# Patient Record
Sex: Male | Born: 1960
Health system: Southern US, Community
[De-identification: ages and names within clinical notes are randomized; demographics above are authoritative.]

## PROBLEM LIST (undated history)

## (undated) DIAGNOSIS — K5909 Other constipation: Secondary | ICD-10-CM

## (undated) DIAGNOSIS — F329 Major depressive disorder, single episode, unspecified: Secondary | ICD-10-CM

## (undated) DIAGNOSIS — B182 Chronic viral hepatitis C: Secondary | ICD-10-CM

## (undated) DIAGNOSIS — Z8673 Personal history of transient ischemic attack (TIA), and cerebral infarction without residual deficits: Secondary | ICD-10-CM

## (undated) DIAGNOSIS — M5136 Other intervertebral disc degeneration, lumbar region: Secondary | ICD-10-CM

## (undated) DIAGNOSIS — M1990 Unspecified osteoarthritis, unspecified site: Secondary | ICD-10-CM

## (undated) DIAGNOSIS — N281 Cyst of kidney, acquired: Secondary | ICD-10-CM

## (undated) DIAGNOSIS — F32A Depression, unspecified: Secondary | ICD-10-CM

## (undated) DIAGNOSIS — Z8639 Personal history of other endocrine, nutritional and metabolic disease: Secondary | ICD-10-CM

## (undated) DIAGNOSIS — K219 Gastro-esophageal reflux disease without esophagitis: Secondary | ICD-10-CM

## (undated) DIAGNOSIS — H9193 Unspecified hearing loss, bilateral: Secondary | ICD-10-CM

## (undated) DIAGNOSIS — M549 Dorsalgia, unspecified: Secondary | ICD-10-CM

## (undated) DIAGNOSIS — J449 Chronic obstructive pulmonary disease, unspecified: Secondary | ICD-10-CM

## (undated) DIAGNOSIS — Z87442 Personal history of urinary calculi: Secondary | ICD-10-CM

## (undated) DIAGNOSIS — N529 Male erectile dysfunction, unspecified: Secondary | ICD-10-CM

## (undated) DIAGNOSIS — M51369 Other intervertebral disc degeneration, lumbar region without mention of lumbar back pain or lower extremity pain: Secondary | ICD-10-CM

## (undated) DIAGNOSIS — G8929 Other chronic pain: Secondary | ICD-10-CM

## (undated) DIAGNOSIS — Z9889 Other specified postprocedural states: Secondary | ICD-10-CM

## (undated) DIAGNOSIS — M4800 Spinal stenosis, site unspecified: Secondary | ICD-10-CM

## (undated) HISTORY — DX: Spinal stenosis, site unspecified: M48.00

## (undated) HISTORY — PX: OTHER SURGICAL HISTORY: SHX169

## (undated) HISTORY — DX: Depression, unspecified: F32.A

## (undated) HISTORY — PX: POSTERIOR LUMBAR FUSION: SHX6036

## (undated) HISTORY — DX: Other intervertebral disc degeneration, lumbar region: M51.36

## (undated) HISTORY — DX: Other intervertebral disc degeneration, lumbar region without mention of lumbar back pain or lower extremity pain: M51.369

## (undated) HISTORY — DX: Major depressive disorder, single episode, unspecified: F32.9

---

## 1987-10-18 HISTORY — PX: OTHER SURGICAL HISTORY: SHX169

## 1988-10-17 HISTORY — PX: ANKLE SURGERY: SHX546

## 1993-10-17 HISTORY — PX: KNEE SURGERY: SHX244

## 1998-10-17 HISTORY — PX: ROTATOR CUFF REPAIR: SHX139

## 2003-11-21 ENCOUNTER — Ambulatory Visit (HOSPITAL_COMMUNITY): Admission: RE | Admit: 2003-11-21 | Discharge: 2003-11-21 | Payer: Self-pay | Admitting: Neurology

## 2004-01-23 ENCOUNTER — Ambulatory Visit (HOSPITAL_COMMUNITY): Admission: RE | Admit: 2004-01-23 | Discharge: 2004-01-23 | Payer: Self-pay | Admitting: Neurology

## 2004-10-04 ENCOUNTER — Inpatient Hospital Stay (HOSPITAL_COMMUNITY): Admission: RE | Admit: 2004-10-04 | Discharge: 2004-10-07 | Payer: Self-pay | Admitting: Neurosurgery

## 2005-05-15 ENCOUNTER — Ambulatory Visit (HOSPITAL_COMMUNITY): Admission: RE | Admit: 2005-05-15 | Discharge: 2005-05-15 | Payer: Self-pay | Admitting: Neurosurgery

## 2005-05-21 ENCOUNTER — Ambulatory Visit (HOSPITAL_COMMUNITY): Admission: RE | Admit: 2005-05-21 | Discharge: 2005-05-21 | Payer: Self-pay | Admitting: Neurosurgery

## 2005-05-27 ENCOUNTER — Ambulatory Visit (HOSPITAL_COMMUNITY): Admission: RE | Admit: 2005-05-27 | Discharge: 2005-05-27 | Payer: Self-pay | Admitting: Neurosurgery

## 2006-06-26 ENCOUNTER — Inpatient Hospital Stay (HOSPITAL_COMMUNITY): Admission: RE | Admit: 2006-06-26 | Discharge: 2006-06-29 | Payer: Self-pay | Admitting: Neurosurgery

## 2006-11-07 ENCOUNTER — Encounter: Admission: RE | Admit: 2006-11-07 | Discharge: 2006-11-14 | Payer: Self-pay | Admitting: Neurosurgery

## 2007-06-13 ENCOUNTER — Emergency Department (HOSPITAL_COMMUNITY): Admission: EM | Admit: 2007-06-13 | Discharge: 2007-06-13 | Payer: Self-pay | Admitting: Emergency Medicine

## 2007-07-01 ENCOUNTER — Encounter: Admission: RE | Admit: 2007-07-01 | Discharge: 2007-07-01 | Payer: Self-pay | Admitting: Neurosurgery

## 2007-09-03 ENCOUNTER — Encounter: Admission: RE | Admit: 2007-09-03 | Discharge: 2007-12-02 | Payer: Self-pay | Admitting: Neurosurgery

## 2007-12-21 ENCOUNTER — Ambulatory Visit: Payer: Self-pay | Admitting: Gastroenterology

## 2008-02-22 ENCOUNTER — Encounter (INDEPENDENT_AMBULATORY_CARE_PROVIDER_SITE_OTHER): Payer: Self-pay | Admitting: Interventional Radiology

## 2008-02-22 ENCOUNTER — Ambulatory Visit (HOSPITAL_COMMUNITY): Admission: RE | Admit: 2008-02-22 | Discharge: 2008-02-22 | Payer: Self-pay | Admitting: Gastroenterology

## 2008-03-20 ENCOUNTER — Ambulatory Visit: Payer: Self-pay | Admitting: Gastroenterology

## 2009-04-15 ENCOUNTER — Ambulatory Visit (HOSPITAL_COMMUNITY): Admission: RE | Admit: 2009-04-15 | Discharge: 2009-04-15 | Payer: Self-pay | Admitting: Neurosurgery

## 2009-11-11 ENCOUNTER — Encounter
Admission: RE | Admit: 2009-11-11 | Discharge: 2010-02-09 | Payer: Self-pay | Admitting: Physical Medicine & Rehabilitation

## 2009-11-12 ENCOUNTER — Ambulatory Visit: Payer: Self-pay | Admitting: Physical Medicine & Rehabilitation

## 2009-11-26 ENCOUNTER — Ambulatory Visit: Payer: Self-pay | Admitting: Physical Medicine & Rehabilitation

## 2009-12-25 ENCOUNTER — Ambulatory Visit: Payer: Self-pay | Admitting: Physical Medicine & Rehabilitation

## 2010-01-22 ENCOUNTER — Ambulatory Visit: Payer: Self-pay | Admitting: Physical Medicine & Rehabilitation

## 2010-05-12 ENCOUNTER — Ambulatory Visit (HOSPITAL_COMMUNITY): Admission: RE | Admit: 2010-05-12 | Discharge: 2010-05-12 | Payer: Self-pay | Admitting: Orthopaedic Surgery

## 2010-11-07 ENCOUNTER — Encounter: Payer: Self-pay | Admitting: Family Medicine

## 2010-11-22 ENCOUNTER — Other Ambulatory Visit (HOSPITAL_COMMUNITY): Payer: Self-pay | Admitting: Neurosurgery

## 2010-11-22 DIAGNOSIS — M542 Cervicalgia: Secondary | ICD-10-CM

## 2010-11-22 DIAGNOSIS — M545 Low back pain: Secondary | ICD-10-CM

## 2010-11-25 ENCOUNTER — Other Ambulatory Visit (HOSPITAL_COMMUNITY): Payer: Self-pay

## 2010-12-03 ENCOUNTER — Ambulatory Visit (HOSPITAL_COMMUNITY)
Admission: RE | Admit: 2010-12-03 | Discharge: 2010-12-03 | Disposition: A | Discharge status: Home / Self Care | Payer: PRIVATE HEALTH INSURANCE | Source: Ambulatory Visit | Attending: Neurosurgery | Admitting: Neurosurgery

## 2010-12-03 ENCOUNTER — Ambulatory Visit (HOSPITAL_COMMUNITY): Payer: Self-pay

## 2010-12-03 DIAGNOSIS — M47812 Spondylosis without myelopathy or radiculopathy, cervical region: Secondary | ICD-10-CM | POA: Insufficient documentation

## 2010-12-03 DIAGNOSIS — M549 Dorsalgia, unspecified: Secondary | ICD-10-CM | POA: Insufficient documentation

## 2010-12-03 DIAGNOSIS — M79609 Pain in unspecified limb: Secondary | ICD-10-CM | POA: Insufficient documentation

## 2010-12-03 DIAGNOSIS — M545 Low back pain: Secondary | ICD-10-CM

## 2010-12-03 DIAGNOSIS — M542 Cervicalgia: Secondary | ICD-10-CM | POA: Insufficient documentation

## 2010-12-03 MED ORDER — GADOBENATE DIMEGLUMINE 529 MG/ML IV SOLN
20.0000 mL | Freq: Once | INTRAVENOUS | Status: AC
  Administered 2010-12-03: 20 mL via INTRAVENOUS

## 2011-01-11 DIAGNOSIS — F341 Dysthymic disorder: Secondary | ICD-10-CM | POA: Insufficient documentation

## 2011-01-11 DIAGNOSIS — B1920 Unspecified viral hepatitis C without hepatic coma: Secondary | ICD-10-CM | POA: Insufficient documentation

## 2011-01-11 DIAGNOSIS — F329 Major depressive disorder, single episode, unspecified: Secondary | ICD-10-CM | POA: Insufficient documentation

## 2011-01-11 DIAGNOSIS — M5430 Sciatica, unspecified side: Secondary | ICD-10-CM | POA: Insufficient documentation

## 2011-01-11 DIAGNOSIS — IMO0002 Reserved for concepts with insufficient information to code with codable children: Secondary | ICD-10-CM

## 2011-01-11 DIAGNOSIS — F419 Anxiety disorder, unspecified: Secondary | ICD-10-CM

## 2011-03-04 NOTE — Op Note (Signed)
NAME:  Shawn Russell, Shawn Russell NO.:  [id number]   MEDICAL RECORD NO.:  [id number]          PATIENT TYPE:  INP   LOCATION:  3005                         FACILITY:  [facility]   PHYSICIAN:  Cristi Loron, M.D.DATE OF BIRTH:  19-Oct-1960   DATE OF PROCEDURE:  06/26/2006  DATE OF DISCHARGE:                                 OPERATIVE REPORT   BRIEF HISTORY:  The patient is a 50 year old white male who has previously  undergone a L4-5 decompression fusion with bilateral laminotomies at L3  several years ago.  Initially, he did well but has developed recurrent back  and bilateral hip and leg pain consistent with neurogenic claudication.  He  was worked up with a lumbar MRI and lumbar CT which demonstrated he had  developed severe multifactorial spinal stenosis L3-4 and moderate stenosis  at L2-3.  He also had a component of congenital spinal stenosis.  I  discussed the various treatment options with the patient including surgery.  The patient has weighed the risks, benefits and alternatives to surgery and  would like to proceed with lumbar decompression and fusion.   PREOPERATIVE DIAGNOSIS:  1. L2-3, L3-4 multifactorial spinal stenosis.  2. Congenital spinal stenosis.  3. Degenerative disease.  4. Lumbago.  5. Lumbar radiculopathy.   POSTOPERATIVE DIAGNOSIS:  1. L2-3, L3-4 multifactorial spinal stenosis.  2. Congenital spinal stenosis.  3. Degenerative disease.  4. Lumbago.  5. Lumbar radiculopathy.   PROCEDURE:  Redo L3 laminectomy; L2-3 and L3-4 transforaminal lumbar  interbody fusion; insertion of L2-3 and L3-4 interbody prosthesis (Capstone  PEEK cage), posterior segmental instrumentation L2 down to L5 with Legacy  Titanium Pedicle Screws and Rods; posterolateral arthrodesis L2-3 and L3-4  with local morselized autograft bone and Vitoss Bone Graft Extender.   SURGEON:  Dr. Delma Officer.   ASSISTANT:  Dr. Shirlean Kelly.   ANESTHESIA:  General endotracheal.   ESTIMATED BLOOD LOSS:  250 mL.   SPECIMENS:  None.   DRAINS:  None.   COMPLICATIONS:  None.   DESCRIPTION OF THE PROCEDURE:  The patient was brought to the operating room  by anesthesia team.  General endotracheal anesthesia was induced.  The  patient was carefully turned to the prone position on the Wilson frame.  His  lumbosacral region was then shaved with the clippers and then prepared with  Betadine scrub and Betadine solution.  Sterile drapes were applied.  I then  injected the area to incised with Marcaine with epinephrine solution.  I  used a scalpel to make a linear midline incision through the patient's  previous surgical scar and extended in a cephalad direction.  I used the  electrocautery to perform a bilateral subperiosteal dissection exposing  spinous processes of the lamina of 1, 2 and 3.  We then loosened the caps on  the old rods and screws at L4-5 and removed the old rods.  We then used a  high-speed drill to perform bilateral L2 and L3 laminotomies.  We  encountered an expected amount of epidural fibrosis at L3-4, i.e. where the  previous surgery was, and we carefully dissected through  the epidural  fibrosis.  We completed the L3 laminectomy using Kerrison punch and  performed a foraminotomy about the bilateral L3 and L4 nerve roots.  We then  widened the laminotomies bilaterally at L2 and performed a foramina about  the L3 nerve root.  We then turned our attention to the posterior lumbar  interbody fusion.  We incised the L2-3 and 3-4 intervertebral disk on the  left side using the 15 blade scalpel.  We performed aggressive  intervertebral dissection using the pituitary forceps and the Scoville and  Epstein curettes.  We cleared soft tissue from the  vertebral endplates at  L2-3 and 3-4 in preparation for the posterior lumbar interbody fusion.  We  then prefilled a 12 x 26 and a 14 x 26-mm Capstone interbody PEEK cage with  local autograft bone and Vitoss Bone  Graft Extender, and then we inserted 12  x 26 cage into the L2-3 interspace and a 14 x 26 cage into the L3-4  interspace, of course after retracting neural structures out of harm's way.  There was a good snug fit of the interbody prosthesis at both levels.  I  should mention we filled ventral and posterior to the prosthesis with Vitoss  and local autograft bone, completing the posterior lumbar interbody fusion.   We now turned attention to the posterior segmental instrumentation.  Under  fluoroscopic guidance, I cannulated the bilateral L2 and 3 pedicles with the  bone probe.  I then tapped the pedicles with 6.5-mm tap, probed inside the  tapped pedicles with a ball probe to rule out cortical breeches and then  inserted 7.5 x 45 pedicle  screws bilaterally at L2 and 3 under fluoroscopic  guidance.  We then palpated along the medial aspect of the bilateral L2 and  L3 pedicles and noted there was no cortical breeches, and nerve roots were  not injured.  We then cut the appropriate length rod.  We bent the rod at  the appropriate lordosis and then connected unilateral pedicle screws from  L2 down to L5 with a rod.  We fashioned it in place using the caps  which we  tightened appropriately, and then we placed a cross connector between the  two rods in which we also flushed tightened appropriately completing the  instrumentation.   We now turned attention to posterolateral arthrodesis, removed the soft  tissue from the L2-3 and 3-4 facettes, using the Leksell rongeur as well as  high-speed drill.  We then laid a combination of Vitoss Bone Graft Extender  and local autograft bone over these decorticated posterolateral structures  completing the posterolateral arthrodesis.  We then irrigated the wound out  with bacitracin solution and removed the solution.  We then inspected the thecal sac at bilateral L2, 3 and 4 nerve roots and noted the neural  structures were well decompressed.  We obtained  hemostasis using bipolar  electrocautery and then removed the retractor.  We reapproximated the  patient's thoracolumbar fascia with interrupted #1 Vicryl suture, the  subcutaneous tissue with interrupted 2-0 Vicryl suture and skin with Steri-  Strips and Benzoin.  The wound was then coated with bacitracin ointment.  A  sterile dressing was applied.  All drapes were removed.  The patient was  subsequently turned to supine position where he was extubated by anesthesia  team and transported to post anesthesia care unit in stable condition.  All  sponge, instrument and needle counts were correct at the end of this case.  Cristi Loron, M.D.  Electronically Signed    JDJ/MEDQ  D:  06/26/2006  T:  06/27/2006  Job:  [id number]

## 2011-03-04 NOTE — Discharge Summary (Signed)
NAME:  Shawn Russell, Shawn Russell NO.:  [id number]   MEDICAL RECORD NO.:  [id number]          PATIENT TYPE:  INP   LOCATION:  3009                         FACILITY:  [facility]   PHYSICIAN:  Cristi Loron, M.D.DATE OF BIRTH:  Dec 30, 1960   DATE OF ADMISSION:  10/04/2004  DATE OF DISCHARGE:  10/07/2004                                 DISCHARGE SUMMARY   BRIEF HISTORY:  The patient is a 50 year old white male who suffered from  chronic back and leg pain.  It became severe.  He has failed medical  management and was worked up with a lumbar MRI which demonstrated he had  diffuse congenital lumbar spinal stenosis.  He had severe multifactorial  spinal stenosis at L4-5 with mild stenosis at L3-4.  I discussed the various  treatment options with the patient.  The patient has weighed the risks,  benefits, and alternatives of surgery, has decided to proceed with an L3-4  and L4-5 decompression as well as a fusion at the L4-5.   For further details of this admission, please refer to typed history and  physical.   HOSPITAL COURSE:  The patient was admitted to [facility] on  October 04, 2004.  On the day of admission, I performed an L3-4  decompression and L4-5 fusion.  The surgery went well and for further  details of this operation, please refer to typed operative note.   POSTOPERATIVE COURSE:  The patient's postoperative course was unremarkable.  We had PT and OT see him.  By the postoperative day #3, i.e., October 07, 2004, the patient was afebrile, vital signs stable.  He was eating well,  ambulating well and his wound was healing well without signs of infection  and was requesting discharge to home.  He was therefore discharged home on  October 07, 2004.   DISCHARGE INSTRUCTIONS:  The patient was given written discharge  instructions, instructed to follow up with me in four weeks.   DISCHARGE PRESCRIPTIONS:  1.  Percocet 10/325, #100, one p.o. q.4h. p.r.n. for  pain.  2.  Valium 5 mg, #50, one p.o. q.6h. p.r.n. for muscle spasm.   FINAL DIAGNOSES:  An L3-4 and L4-5 spinal stenosis, degenerative disease,  lumbar radiculopathy, lumbago.   PROCEDURE PERFORMED:  An L3 and L5 laminectomy, foraminotomy; L4-5 posterior  lumbar interbody fusion with placement of L4-5 interbody prosthesis,  (capstone peek cages); L4-5 posterior and lateral segment instrumentation  with Legacy titanium pedicle screws and rods; L4-5 posterolateral  arthrodesis, local morselized autograft bone and Kronos bone filler.      JDJ/MEDQ  D:  12/09/2004  T:  12/09/2004  Job:  [id number]

## 2011-03-04 NOTE — Op Note (Signed)
NAME:  Shawn Russell, Shawn Russell NO.:  [id number]   MEDICAL RECORD NO.:  [id number]          PATIENT TYPE:  INP   LOCATION:  3009                         FACILITY:  [facility]   PHYSICIAN:  Cristi Loron, M.D.DATE OF BIRTH:  [birth year]   DATE OF PROCEDURE:  10/04/2004  DATE OF DISCHARGE:                                 OPERATIVE REPORT   BRIEF HISTORY:  The patient is a 50 year old white male who has suffered  from chronic back and leg pain.  It became severe.  He has failed medical  management and was worked up with a lumbar MRI which demonstrated the  patient had diffuse congenital lumbar spinal stenosis.  He also had  multifactorial severe spinal stenosis at L4-5 with mild spinal stenosis at  L3-4.  I discussed the various treatment options with the patient including  surgery.  The patient has weighed the risks, benefits and alternatives of  surgery and has decided to proceed with an L3-4 and 4-5 decompression and  fusion at L4-5.   PREOPERATIVE DIAGNOSIS:  L3-4 and L4-5 spinal stenosis, lumbar radiculopathy  and lumbago.   POSTOPERATIVE DIAGNOSIS:  L3-4 and L4-5 spinal stenosis, lumbar  radiculopathy and lumbago.   PROCEDURE:  L3 and L5 laminectomy and foraminotomy; L4-5 posterior lumbar  interbody fusion with placement of L4-5 interbody prosthesis, L4-5 posterior  non-segmental instrumentation with Legacy titanium pedicle screws and rods,  L4-5 posterolateral arthrodesis with local morselized autograft bone and  Cronos bone filler.   SURGEON:  Cristi Loron, M.D.   ASSISTANT:  Hilda Lias, M.D.   ANESTHESIA:  General endotracheal anesthesia.   ESTIMATED BLOOD LOSS:  200 mL.   SPECIMENS:  None.   DRAINS:  None.   COMPLICATIONS:  None.   DESCRIPTION OF PROCEDURE:  The patient was brought to the operating room by  the anesthesia team.  General endotracheal anesthesia was induced.  The  patient was turned to the prone position on the Wilson frame.  His  lumbosacral region was then shaved with the clippers and prepared with  Betadine scrub and solution.  Sterile drapes were applied.  I then injected  the area to be incised with Marcaine with epinephrine solution and used a  scalpel to make a linear midline incision up the L3-4 and L4-5 interspace.  I used electrocautery to provide a subperiosteal dissection exposing the  spinous process at the lamina of L3, 4 and 5.  I inserted the Versatrax  retractor for exposure after obtaining an intraoperative radiograph to  confirm our location.  We began by incising the L4-5 and L3-4 interspinous  ligament with the scalpel and then we used the Leksell rongeur to remove the  L4 spinous process and the L4 lamina.  We saved this bone and later created  enough soft tissue and morselized it to be used in the fusion process.  We  then used the high speed drill to perform bilateral L4 and L3 laminotomy. We  then completed the L4 laminectomy and widened the L3 laminotomy using the  Kerrison punch.  We removed the L3-4 and L4-5 ligamentum flavum.  We  performed foraminotomies at the bilateral L4 and L5 nerve roots. We  encountered a considerable amount of stenosis at the cephalad aspect of the  L5 lamina. We therefore used the high speed drill to thin out the cephalad  aspect of the L5 lamina and performed a partial laminectomy over the  cephalad aspect of the L5 lamina to further decompress the thecal sac and  the L5 nerve roots. At this point, we were satisfied with the decompression  and we now turned our attention to the fusion.   We freed the thecal sac and the L5 nerve roots from the epidural tissue and  then gently retracted with the D'Errico retractor exposing the L4-5  intervertebral disk.  We incised the intervertebral disk to perform an  aggressive diskectomy using the pituitary forceps and the Epstein and  Scoville curette.  We did this bilaterally.  We then prepared the vertebral  end  plates at Z6-1 by inserting a disk space spreader in the contralateral  disk space, distracting the interspace and then clearing all the soft tissue  of the ipsilateral disk space using the curette.  We then placed a 12 x 26  mm Capsin Peak cage which had been pre-filled with local morselized  autograft bone and Cronos bone filler into the ipsilateral disk space of  course after retracting the neural structures out of harm's way.  We then  removed the distractor from the contralateral side and then prepared the  vertebral interspace at that side in an analogous fashion. We filled  medially with  local morselized autograft bone and Cronos bone scaffolding.  We then placed a second Capsin Peak cage, 12 x 26, in the contralateral disk  space again after retracting the neural structures out of harm's way  completing the posterior lumbar interbody fusion.   We now turned our attention to the instrumentation.  We used the  electrocautery to expose the transverse process at L4 and L5 bilaterally and  the under fluoroscopic guidance we cannulated the L4 and L5 pedicles with  the bone probe, tapped the pedicles with a 5.5 mm tap, probed and tapped the  pedicles to rule out cortical breaches and then inserted a 6.5 x 45 mm  pedicle screw bilaterally at L4 and L5 under fluoroscopic guidance. We then  probed about the medial aspect bilaterally at the L4 and L5 pedicles and  noted that there were no cortical breaches and the L4 and L5 nerve roots  were not injured.  We then connected the unilateral pedicle screw to the  lordotic rod in a compressive construct and then secured the rod in place  with the caps which we tightened appropriately completing the  instrumentation.   We now turned our attention to the posterolateral arthrodesis.  We used a  high speed drill to decorticate the remainder of the L4-5 facet and the L4  pars region and transverse process of L4 and L5.  We then laid a combination of  local morselized autograft bone and Cronos bone scaffolding over these  decorticated posterolateral structures completing the posterolateral  arthrodesis.  We then obtained stringent hemostasis using bipolar  electrocautery.  We copiously irrigated the wound out with Bacitracin  solution.  We removed the solution and then we inspected the thecal sac and  noted that it was well decompressed as well as the bilateral L4 and L5 nerve  roots.  We then removed the Versatrax retractor and reapproximated the  patient's thoracolumbar fascia with interrupted #  1 Vicryl suture, the  subcutaneous with interrupted 2-0 Vicryl suture and the skin with Steri-  Strips and Benzoin.  The wound was then coated with Bacitracin ointment and  a sterile dressing was applied.  The drapes were removed.  The patient was  subsequently returned to the supine position where he was extubated by the  anesthesia team and transported to the post-anesthesia care unit in stable  condition.  All sponge, instrument and needle counts were correct at the end  of the case.       JDJ/MEDQ  D:  10/04/2004  T:  10/05/2004  Job:  [id number]

## 2011-03-04 NOTE — Discharge Summary (Signed)
NAME:  Shawn Russell, Shawn Russell NO.:  [id number]   MEDICAL RECORD NO.:  [id number]          PATIENT TYPE:  INP   LOCATION:  3005                         FACILITY:  [facility]   PHYSICIAN:  Cristi Loron, M.D.DATE OF BIRTH:  01-23-1961   DATE OF ADMISSION:  06/26/2006  DATE OF DISCHARGE:  06/29/2006                                 DISCHARGE SUMMARY   BRIEF HISTORY:  The patient is a 50 year old white male who has previously  undergone an L4-5 decompression and fusion with bilateral laminotomies at L3  several years ago.  Initially, he did well but has developed recurrent back  and bilateral hip and leg pain consistent with a neurogenic claudication.  He was worked with worked with a lumbar MRI and a lumbar CT, which  demonstrated he had developed multifactorial spinal stenosis at L3-4 and  minor stenosis at L2-3.  He also had a component of congenital spinal  stenosis.  I discussed the various treatment options with the patient  including surgery.  The patient is aware of the risk, benefits, and  alternatives to surgery and elects to proceed with lumbar decompression and  fusion.   For further details of this admission, please refer to the type history and  physical.   HOSPITAL COURSE:  I admitted the patient to [facility] on June 26, 2006.  On the day of admission, I performed an L2-3 and L3-4  decompression and fusion.  The surgery went well and for further details of  this operation, please refer to typed operative report.   POSTOPERATIVE COURSE:  The patient's postoperative course was unremarkable.  He was discharged home on postop day #3.   DISCHARGE INSTRUCTIONS:  The patient was given written discharge  instructions and instructed to follow up with me in 4 weeks.   DISCHARGE PRESCRIPTIONS:  1. Percocet 10/325, #100, one p.o. q.4 hours p.r.n. for pain.  2. Valium 5 mg, #50, one p.o. q.6 hours p.r.n. muscle spasm.   FINAL DIAGNOSIS:  L2-3 and L3-4  multifactorial spinal stenosis, congenital  spinal stenosis, degenerative disk disease, lumbago, lumbar radiculopathy.   PROCEDURE PERFORMED:  Redo L3 laminectomy; L2-3 and L3-4 transforaminal  lumbar interbody fusion; insertion of L2-3 and L3-4 interbody prosthesis  (capsule and PEEK cages); posterior segmental instrumentation L2-L5 with  Legacy titanium pedicle screws and rods; posterolateral orthesis L2-3 and L3-  4 with local morcellized autograft bone and vitos bone-graft extender.      Cristi Loron, M.D.  Electronically Signed    JDJ/MEDQ  D:  07/28/2006  T:  07/29/2006  Job:  [id number]

## 2012-05-10 ENCOUNTER — Ambulatory Visit (INDEPENDENT_AMBULATORY_CARE_PROVIDER_SITE_OTHER): Payer: PRIVATE HEALTH INSURANCE | Admitting: Gastroenterology

## 2012-05-10 DIAGNOSIS — B182 Chronic viral hepatitis C: Secondary | ICD-10-CM

## 2012-05-12 LAB — CBC WITH DIFFERENTIAL/PLATELET
Basophils Absolute: 0 10*3/uL (ref 0.0–0.1)
Eosinophils Absolute: 0.3 10*3/uL (ref 0.0–0.7)
Eosinophils Relative: 6 % — ABNORMAL HIGH (ref 0–5)
MCH: 32.3 pg (ref 26.0–34.0)
MCV: 90.9 fL (ref 78.0–100.0)
Platelets: 198 10*3/uL (ref 150–400)
RDW: 13.5 % (ref 11.5–15.5)

## 2012-05-12 LAB — HEPATIC FUNCTION PANEL
ALT: 30 U/L (ref 0–53)
AST: 35 U/L (ref 0–37)
Alkaline Phosphatase: 63 U/L (ref 39–117)
Bilirubin, Direct: 0.1 mg/dL (ref 0.0–0.3)
Indirect Bilirubin: 0.3 mg/dL (ref 0.0–0.9)

## 2012-05-17 NOTE — Progress Notes (Signed)
NAME:  Shawn Russell, FOUNTAIN  MR#:  [id number]      DATE:  05/10/2012  DOB:  [birth year]       REFERRING PHYSICIAN: Ernestina Penna, M.D., [facility], [address] [place], [place] [address], Fax # [phone]  consulting physician:  Tressie Stalker, M.D., [facility], [address], [address], [place], [place] [address].  reason for visit:   Follow up of genotype 1 hepatitis C.   HISTORY:  The patient returns today unaccompanied. He is a 51 year old gentleman with history of genotype 1 hepatitis C, with a liver biopsy on 02/22/2008, showing grade 2, stage II disease without significant iron deposition. He was treated with a combination of pegylated interferon and telaprevir for 48 weeks as part of a protocol. I do not have his viral kinetics at this time, but in speaking to his research coordinators, she was certain he received telaprevir and not placebo. He was a relapser. He required dose reduction in his pegylated interferon to 135 mcg because of neutropenia, but he was able to maintain a full dose of ribavirin 600 mg b.i.d. His start date for treatment was around 05/17/2008, and he completed therapy on his Pegasys on 04/10/2009, and his ribavirin 04/16/2009. He required the addition of Celexa through therapy since 08/08/2008, for mood stability. As previously mentioned, he was a relapser. Brooke Dare, the coordinator will look up viral kinetics and send them to me.   He had contacted our office complaining of right flank pain, which has been present for several months. When asked when it occurs, he reports it can start in the morning, occur anytime during the day. There is no associated symptoms such as nausea, vomiting, diarrhea, or constipation. There is no particular foods that bring this on. There is no relationship to eating. When I ask about any associated symptoms, he then started into a long conversation about his back pain, and how this  interferes with his sleeping and how he sleeps in a recliner.   There are truly no symptoms referable to his history of hepatitis C. There is no jaundice, acholic stools, or dark urine. His weight is up from the last previously documented weight of 96.2 kilograms, at current weight of 100.9 kilograms. There are no symptoms to suggest decompensated or cryoglobulin mediated disease.   PAST MEDICAL HISTORY:  Significant for degenerative disk disease with previous back surgeries. He also has a history of left knee surgery with chronic pain from this. He has a history of depression related to marital issues prior to his commencement of therapy, and again was able to tolerate therapy with the addition of an SSRI.   CURRENT MEDICATIONS:  1. Voltaren gel 1% daily.  2. Opana ER 30 mg b.i.d. 3. Oxymorphone 5 mg b.i.d.  4. Diazepam 10 mg t.i.d.  5. Lexapro 20 mg daily.  6. Trazodone 50 mg to 100 mg daily p.r.n.   ALLERGIES:  Denies.   HABITS:  Smoking is 1.5 packs of cigarettes per day. Alcohol: Denies significant use, reporting he has only had 7 beers since 1992.   REVIEW OF SYSTEMS:  All 10 systems reviewed today with the patient, and they were negative other than which is mentioned above. His CES-D was 45.   PHYSICAL EXAMINATION:  Constitutional: Stated age without stigmata of chronic liver disease or peripheral wasting. Vital signs: Height 71 inches, weight 222 pounds or 100.9 kilograms, blood pressure 113/75, pulse 77, temperature 97.6 degrees Fahrenheit. Ears, Nose, Mouth and  Throat:  Unremarkable oropharynx. No thyromegaly or neck masses. Chest:  Resonant to percussion. Clear to auscultation. Cardiovascular: Heart sounds normal S1, S2 without murmurs or rubs. There is no peripheral edema. Abdomen: Normal bowel sounds. No masses or tenderness. There was no tenderness to deep palpation in the right upper quadrant. Murphy's sign was negative. I could not appreciate a liver edge or spleen tip. I  could not appreciate any hernias. Lymphatics: No cervical or inguinal lymphadenopathy. Central Nervous System: No asterixis or focal neurologic findings.  Dermatologic: Anicteric without palmar erythema or spider angiomata.  Eyes: Anicteric sclerae. Pupils are equal and reactive to light.  LABORATORIES:  Most recent lab work was none.    ASSESSMENT:  The patient is a 51 year old gentleman with history of genotype 1 hepatitis C with previous biopsy on 02/22/2008, showing grade 2, stage II disease, who was treated with a combination of 48 weeks of pegylated interferon and ribavirin with a dose reduction of the pegylated interferon for neutropenia in combination with simeprevir as part of a protocol from early 05/2008 to 06/25 and 04/16/2009. As mentioned, he is a relapser to therapy.   In terms of his genotype 1 hepatitis C, I would like to get his exact viral kinetics, but he may benefit from a combination of interferon, ribavirin and sofosbuvir. As a relapser, he may have an SVR that approaches that of a naive patient. Alternatively, he may do well with combination of sofosbuvir in combination of daclatasvir which has been studied for protease inhibitor failures. However, daclatasvir is not expected for some time.   The right flank pain is not related to his liver. There are no symptoms to suggest biliary disease.  Murphy's sign is negative today. It is quite telling that when asked about his pain, he combines this with his other musculoskeletal complaints, which are also not related to his liver disease.   In my discussion today with the patient, most of the time was spent discussing the hepatitis C as his pain is not related to his liver disease. We discussed the possibly of waiting for either sofosbuvir in combination with PEG-Intron and ribavirin, or the availability of sofosbuvir with other agents such as daclatasvir to treat his genotype 1 hepatitis C.   PLAN:  1. Immune to hepatitis B, based  on the total B core antibody positivity on 12/21/2007.  2. In terms of his hepatitis A, he received 1 vaccine on 03/20/2008, and never received a second, so will check his total hepatitis A antibody today.  3. I have asked the research coordinator previously caring for him to provide me records regarding his viral kinetics while on treatment.  4. Today, I have drawn in addition to hepatitis A antibody, a CBC, INR, and liver enzymes.  5. He is to return within approximately 6 months' time to [facility] to continue his care.            Brooke Dare, MD   ADDENDUM:  Hepatitis A Ab negative.  Labs normal.  Treated with:            [id number], 75 mg. + PEG + RBV for 12 weeks and then PEG+RBV for the reaming 36 weeks.          Relapse confirmed at 4 weeks post tx.   I will have to get his tx. response through the study to you later.   Brooke Dare, CCRC   [phone]  D:  Thu Jul 25 18:24:32 2013 ; T:  Fri Jul 26 09:39:44 2013  Job #:  [id number]

## 2012-10-17 DIAGNOSIS — Z9889 Other specified postprocedural states: Secondary | ICD-10-CM

## 2012-10-17 HISTORY — DX: Other specified postprocedural states: Z98.890

## 2013-01-21 ENCOUNTER — Encounter (HOSPITAL_COMMUNITY): Payer: Self-pay | Admitting: *Deleted

## 2013-01-21 ENCOUNTER — Emergency Department (HOSPITAL_COMMUNITY)
Admission: EM | Admit: 2013-01-21 | Discharge: 2013-01-21 | Disposition: A | Discharge status: Home / Self Care | Payer: PRIVATE HEALTH INSURANCE | Attending: Emergency Medicine | Admitting: Emergency Medicine

## 2013-01-21 DIAGNOSIS — F17200 Nicotine dependence, unspecified, uncomplicated: Secondary | ICD-10-CM | POA: Insufficient documentation

## 2013-01-21 DIAGNOSIS — K529 Noninfective gastroenteritis and colitis, unspecified: Secondary | ICD-10-CM

## 2013-01-21 DIAGNOSIS — R1084 Generalized abdominal pain: Secondary | ICD-10-CM | POA: Insufficient documentation

## 2013-01-21 DIAGNOSIS — K2900 Acute gastritis without bleeding: Secondary | ICD-10-CM | POA: Insufficient documentation

## 2013-01-21 DIAGNOSIS — E876 Hypokalemia: Secondary | ICD-10-CM

## 2013-01-21 LAB — CBC WITH DIFFERENTIAL/PLATELET
Eosinophils Absolute: 0 10*3/uL (ref 0.0–0.7)
Eosinophils Relative: 0 % (ref 0–5)
HCT: 49 % (ref 39.0–52.0)
Lymphs Abs: 1.3 10*3/uL (ref 0.7–4.0)
MCH: 32.1 pg (ref 26.0–34.0)
MCV: 88.8 fL (ref 78.0–100.0)
Monocytes Absolute: 0.6 10*3/uL (ref 0.1–1.0)
Platelets: 214 10*3/uL (ref 150–400)
RBC: 5.52 MIL/uL (ref 4.22–5.81)
RDW: 13 % (ref 11.5–15.5)

## 2013-01-21 LAB — HEPATIC FUNCTION PANEL
ALT: 37 U/L (ref 0–53)
Alkaline Phosphatase: 73 U/L (ref 39–117)
Bilirubin, Direct: 0.4 mg/dL — ABNORMAL HIGH (ref 0.0–0.3)
Indirect Bilirubin: 0.7 mg/dL (ref 0.3–0.9)
Total Protein: 8.6 g/dL — ABNORMAL HIGH (ref 6.0–8.3)

## 2013-01-21 LAB — BASIC METABOLIC PANEL
Calcium: 9.5 mg/dL (ref 8.4–10.5)
Creatinine, Ser: 0.71 mg/dL (ref 0.50–1.35)
GFR calc non Af Amer: >90 mL/min (ref >90)
Sodium: 137 mEq/L (ref 135–145)

## 2013-01-21 LAB — URINE MICROSCOPIC-ADD ON

## 2013-01-21 LAB — URINALYSIS, ROUTINE W REFLEX MICROSCOPIC
Protein, ur: 30 mg/dL — AB
Urobilinogen, UA: 1 mg/dL (ref 0.0–1.0)

## 2013-01-21 LAB — LIPASE, BLOOD: Lipase: 19 U/L (ref 11–59)

## 2013-01-21 MED ORDER — SODIUM CHLORIDE 0.9 % IV BOLUS (SEPSIS)
1000.0000 mL | Freq: Once | INTRAVENOUS | Status: AC
  Administered 2013-01-21: 1000 mL via INTRAVENOUS

## 2013-01-21 MED ORDER — ONDANSETRON HCL 4 MG/2ML IJ SOLN
4.0000 mg | Freq: Once | INTRAMUSCULAR | Status: AC
  Administered 2013-01-21: 4 mg via INTRAVENOUS
  Filled 2013-01-21: qty 2

## 2013-01-21 MED ORDER — KETOROLAC TROMETHAMINE 30 MG/ML IJ SOLN
30.0000 mg | Freq: Once | INTRAMUSCULAR | Status: AC
  Administered 2013-01-21: 30 mg via INTRAVENOUS
  Filled 2013-01-21 (×2): qty 1

## 2013-01-21 MED ORDER — POTASSIUM CHLORIDE CRYS ER 20 MEQ PO TBCR
40.0000 meq | EXTENDED_RELEASE_TABLET | Freq: Once | ORAL | Status: AC
  Administered 2013-01-21: 40 meq via ORAL
  Filled 2013-01-21 (×2): qty 2

## 2013-01-21 MED ORDER — ONDANSETRON HCL 4 MG/2ML IJ SOLN
4.0000 mg | Freq: Once | INTRAMUSCULAR | Status: AC
  Administered 2013-01-21: 4 mg via INTRAVENOUS
  Filled 2013-01-21 (×2): qty 2

## 2013-01-21 MED ORDER — PROMETHAZINE HCL 25 MG/ML IJ SOLN
12.5000 mg | Freq: Once | INTRAMUSCULAR | Status: AC
  Administered 2013-01-21: 12.5 mg via INTRAVENOUS
  Filled 2013-01-21: qty 1

## 2013-01-21 MED ORDER — POTASSIUM CHLORIDE 10 MEQ/100ML IV SOLN
10.0000 meq | Freq: Once | INTRAVENOUS | Status: AC
  Administered 2013-01-21: 10 meq via INTRAVENOUS
  Filled 2013-01-21 (×2): qty 100

## 2013-01-21 MED ORDER — POTASSIUM CHLORIDE ER 10 MEQ PO TBCR: 20.0000 meq | EXTENDED_RELEASE_TABLET | Freq: Two times a day (BID) | ORAL | Status: DC

## 2013-01-21 MED ORDER — ONDANSETRON HCL 8 MG PO TABS: 8.0000 mg | ORAL_TABLET | Freq: Three times a day (TID) | ORAL | Status: DC | PRN

## 2013-01-21 NOTE — ED Notes (Signed)
CRITICAL VALUE ALERT  Critical value received:  K+ 2.7  Date of notification:  01/22/13  Time of notification:  1324  Critical value read back:yes  Nurse who received alert:  Santiago Bur, RN  MD notified (1st page):    Time of first page:    MD notified (2nd page):  Time of second page:  Responding MD:  Dr. Judd Lien  Time MD responded:  1324

## 2013-01-21 NOTE — ED Notes (Signed)
Pt vomited minute amount of emesis. Requesting nausea med. edp aware. vo phenergen 12.5mg  iv to be given. Read back and verified.

## 2013-01-21 NOTE — ED Notes (Signed)
Pt c/o of n/v/d w/ fever that started yesterday. Pt c/o of generalized abd pain. Pt states he is urinating very little when he goes to the bathroom.

## 2013-01-21 NOTE — ED Provider Notes (Signed)
History  This chart was scribed for Geoffery Lyons, MD by Bennett Scrape, ED Scribe. This patient was seen in room APA07/[facility]07 and the patient's care was started at 1:03 PM.  CSN: [id number]  Arrival date & time 01/21/13  1154   First MD Initiated Contact with Patient 01/21/13 1303      Chief Complaint  Patient presents with  . Emesis  . Diarrhea     The history is provided by the patient and the spouse. No language interpreter was used.    Shawn Russell is a 53 y.o. male who presents to the Emergency Department complaining of 24 hours of gradual onset, gradually worsening, constant generalized abdominal pain with associated nausea, emesis and diarrhea. Wife reports having milder symptoms at home. Pt denies any suspect foods. Wife reports one prior episode that required admission to [facility] one month ago for IV fluids. He is currently on morphine for spinal stenosis. He denies hematochezia and hematemesis as associated symptoms. Pt reports a h/o prior back surgeries but does not have a h/o any other chronic medical conditions or prior abdominal surgeries. Pt is a current everyday smoker but denies alcohol use.  History reviewed. No pertinent past medical history.  History reviewed. No pertinent past surgical history.  No family history on file.  History  Substance Use Topics  . Smoking status: Current Every Day Smoker  . Smokeless tobacco: Not on file  . Alcohol Use: No      Review of Systems  A complete 10 system review of systems was obtained and all systems are negative except as noted in the HPI and PMH.   Allergies  Review of patient's allergies indicates no known allergies.  Home Medications   Current Outpatient Rx  Name  Route  Sig  Dispense  Refill  . aspirin 325 MG tablet   Oral   Take 325 mg by mouth daily.           . diazepam (VALIUM) 10 MG tablet   Oral   Take 10 mg by mouth 2 (two) times daily.           Marland Kitchen escitalopram (LEXAPRO) 10 MG tablet  Oral   Take 10 mg by mouth daily.           Marland Kitchen oxycodone (OXYCONTIN) 30 MG TB12   Oral   Take 30 mg by mouth every 12 (twelve) hours.             Triage Vitals: BP 137/84  Pulse 90  Temp(Src) 97.9 F (36.6 C) (Oral)  Resp 20  Ht 5\' 11"  (1.803 m)  Wt 200 lb (90.719 kg)  BMI 27.91 kg/m2  SpO2 99%  Physical Exam  Nursing note and vitals reviewed. Constitutional: He is oriented to person, place, and time. He appears well-developed and well-nourished. No distress.  HENT:  Head: Normocephalic and atraumatic.  Mouth/Throat: Oropharynx is clear and moist.  Eyes: Conjunctivae and EOM are normal. Pupils are equal, round, and reactive to light.  Neck: Neck supple. No tracheal deviation present.  Cardiovascular: Normal rate and regular rhythm.   No murmur heard. Pulmonary/Chest: Effort normal and breath sounds normal. No respiratory distress.  Abdominal: Soft. There is no tenderness.  Musculoskeletal: Normal range of motion.  Neurological: He is alert and oriented to person, place, and time.  Skin: Skin is warm and dry.  Psychiatric: He has a normal mood and affect. His behavior is normal.    ED Course  Procedures (including critical care time)  DIAGNOSTIC STUDIES: Oxygen Saturation is 99% on room air, normal by my interpretation.    COORDINATION OF CARE:   Labs Reviewed  CBC WITH DIFFERENTIAL - Abnormal; Notable for the following:    WBC 11.5 (*)    Hemoglobin 17.7 (*)    MCHC 36.1 (*)    Neutrophils Relative 84 (*)    Neutro Abs 9.6 (*)    Lymphocytes Relative 11 (*)    All other components within normal limits  BASIC METABOLIC PANEL  URINALYSIS, ROUTINE W REFLEX MICROSCOPIC   No results found.   No diagnosis found.    MDM  The presentation, exam, and workup are consistent with acute gastroenteritis.  He is hypokalemic and this was replaced through the iv and oral routes.  The patient is feeling better with fluids, meds.   Will treat with zofran odt, return  prn.     I personally performed the services described in this documentation, which was scribed in my presence. The recorded information has been reviewed and is accurate.           Geoffery Lyons, MD 01/21/13 [phone]

## 2013-01-24 ENCOUNTER — Telehealth: Payer: Self-pay | Admitting: Nurse Practitioner

## 2013-01-24 NOTE — Telephone Encounter (Signed)
PLEASE ADVISE.

## 2013-01-24 NOTE — Telephone Encounter (Signed)
NTBS here to document problem so can do referral

## 2013-01-25 ENCOUNTER — Telehealth: Payer: Self-pay | Admitting: *Deleted

## 2013-01-25 NOTE — Telephone Encounter (Signed)
Pt scheduled for Monday with MMM.

## 2013-01-28 ENCOUNTER — Ambulatory Visit (INDEPENDENT_AMBULATORY_CARE_PROVIDER_SITE_OTHER): Payer: PRIVATE HEALTH INSURANCE | Admitting: Nurse Practitioner

## 2013-01-28 ENCOUNTER — Encounter: Payer: Self-pay | Admitting: Nurse Practitioner

## 2013-01-28 VITALS — BP 105/73 | HR 84 | Temp 99.4°F | Ht 71.0 in

## 2013-01-28 DIAGNOSIS — R1110 Vomiting, unspecified: Secondary | ICD-10-CM

## 2013-01-28 DIAGNOSIS — R1013 Epigastric pain: Secondary | ICD-10-CM

## 2013-01-28 NOTE — Patient Instructions (Signed)
If reoccurrence of vomiting: First 24 Hours-Clear liquids  popsicles  Jello  gatorade  Sprite Second 24 hours-Add Full liquids ( Liquids you cant see through) Third 24 hours- Bland diet ( foods that are baked or broiled)  *avoiding fried foods and highly spiced foods* During these 3 days  Avoid milk, cheese, ice cream or any other dairy products  Avoid caffeine- REMEMBER Mt. Dew and Mello Yellow contain lots of caffeine You should eat and drink in  Frequent small volumes If no improvement in symptoms or worsen in 2-3 days should RETRUN TO OFFICE or go to ER!

## 2013-01-28 NOTE — Progress Notes (Signed)
  Subjective:    Patient ID: Shawn Russell, male    DOB: Apr 06, 1961, 52 y.o.   MRN: [id number]  HPI- patient in C/O intermittent Abdominal pain and vomiting. Was in Hospital for couple of days in March with dehydration for vomiting. Hasn't thrown up since Saturday. Needs referral for GI consultation.     Review of Systems  Constitutional: Negative for fever.  HENT: Negative.   Eyes: Negative.   Respiratory: Negative.   Cardiovascular: Negative.   Gastrointestinal: Positive for nausea and constipation (due to pain meds). Negative for vomiting (not since saturday).  Endocrine: Negative.   Genitourinary: Negative.   Musculoskeletal: Negative.   Hematological: Negative.   Psychiatric/Behavioral: Negative.        Objective:   Physical Exam  Constitutional: He is oriented to person, place, and time. He appears well-developed and well-nourished.  Cardiovascular: Normal rate, normal heart sounds and intact distal pulses.   Pulmonary/Chest: Effort normal and breath sounds normal.  Abdominal: Soft. Bowel sounds are normal. He exhibits no mass. There is no tenderness. There is no rebound and no guarding.  Neurological: He is alert and oriented to person, place, and time.  Skin: Skin is warm.  BP 105/73  Pulse 84  Temp(Src) 99.4 F (37.4 C) (Oral)  Ht 5\' 11"  (1.803 m)         Assessment & Plan:  Abdominal pain, epigastric - Plan: Ambulatory referral to Gastroenterology  Intermittent vomiting - Plan: Ambulatory referral to Gastroenterology  Referral to GI First 24 Hours-Clear liquids  popsicles  Jello  gatorade  Sprite Second 24 hours-Add Full liquids ( Liquids you cant see through) Third 24 hours- Bland diet ( foods that are baked or broiled)  *avoiding fried foods and highly spiced foods* During these 3 days  Avoid milk, cheese, ice cream or any other dairy products  Avoid caffeine- REMEMBER Mt. Dew and Mello Yellow contain lots of caffeine You should eat and drink in   Frequent small volumes If no improvement in symptoms or worsen in 2-3 days should RETRUN TO OFFICE or go to ER!    Mary-Margaret Daphine Deutscher, FNP

## 2013-02-08 ENCOUNTER — Encounter: Payer: Self-pay | Admitting: Gastroenterology

## 2013-02-08 ENCOUNTER — Ambulatory Visit (INDEPENDENT_AMBULATORY_CARE_PROVIDER_SITE_OTHER): Payer: PRIVATE HEALTH INSURANCE | Admitting: Gastroenterology

## 2013-02-08 VITALS — BP 109/70 | HR 86 | Temp 97.6°F | Ht 71.0 in | Wt 213.8 lb

## 2013-02-08 DIAGNOSIS — R1310 Dysphagia, unspecified: Secondary | ICD-10-CM

## 2013-02-08 DIAGNOSIS — R1013 Epigastric pain: Secondary | ICD-10-CM

## 2013-02-08 DIAGNOSIS — R112 Nausea with vomiting, unspecified: Secondary | ICD-10-CM

## 2013-02-08 DIAGNOSIS — K625 Hemorrhage of anus and rectum: Secondary | ICD-10-CM

## 2013-02-08 DIAGNOSIS — R1314 Dysphagia, pharyngoesophageal phase: Secondary | ICD-10-CM

## 2013-02-08 DIAGNOSIS — R1011 Right upper quadrant pain: Secondary | ICD-10-CM | POA: Insufficient documentation

## 2013-02-08 DIAGNOSIS — B1920 Unspecified viral hepatitis C without hepatic coma: Secondary | ICD-10-CM

## 2013-02-08 MED ORDER — PEG 3350-KCL-NA BICARB-NACL 420 G PO SOLR: 4000.0000 mL | ORAL | Status: DC

## 2013-02-08 NOTE — Assessment & Plan Note (Signed)
EGD with dilation if needed. Patient has intermittent symptoms. See A+P for N/V.

## 2013-02-08 NOTE — Assessment & Plan Note (Signed)
Refer patient back to Hepatitis Clinic for f/u HCV.

## 2013-02-08 NOTE — Patient Instructions (Addendum)
We have scheduled you for an upper endoscopy with possible esophageal dilation and colonoscopy with Dr. Jena Gauss. Please see separate instructions. I will obtain records from [facility] for review. We will let you know if you need to have any further testing. We will refer you back to the hepatitis clinic for followup of your chronic hepatitis C.

## 2013-02-08 NOTE — Progress Notes (Signed)
Primary Care Physician:  MOORE, DONALD, MD  Primary Gastroenterologist:  Michael Rourk, MD   Chief Complaint  Patient presents with  . Nausea  . Emesis  . Abdominal Pain    HPI:  Shawn Russell is a 51 y.o. male here for further evaluation of recurrent N/V, epigastric/ruq pain. He has h/o chronic HCV followed at [facility] in [place] until last year. He reports he was never contacted about follow up visit. There has been restructuring of the clinic since his last visit there in 04/2012. 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 He was treated with pegylated interferon, telaprevir, ribavirin but was a relapser. He is on chronic narcotic for spinal stenosis, managed through pain clinic.   He reports intermittent N/V, abd pain since 10/2012. Initially thought he had stomach virus and/or related to pain medications. Never had problem with pain meds before except constipation. Controlled with high fiber diet. Usually two meals per day. Hospitalized at [facility] for two days in 12/2012, N/V/D. Then to ED at [facility] couple of weeks ago for N/V/Epig/ruq pain. Last vomited couple of weeks ago.   Denies heartburn. Some dysphagia, solid food occasionally. No pill dysphagia. BM formed but not hard. QOD. Some brbpr when had diarrhea several months ago. No melena. He reports that he lost 40 pounds since this started in 10/2012. He has gained about 13 pounds back per his report. Used to weight 240lbs.    Stopped ASA on his own earlier this week. No other NSAIDS.  Current Outpatient Prescriptions  Medication Sig Dispense Refill  . diazepam (VALIUM) 10 MG tablet Take 10 mg by mouth 3 (three) times daily.       . escitalopram (LEXAPRO) 10 MG tablet Take 20 mg by mouth daily.       . MORPHINE SULFATE ER PO Take 60 mg by mouth 3 (three) times daily.      . oxymorphone (OPANA) 5 MG tablet Take 5 mg by mouth every 8 (eight) hours as needed for pain.      . aspirin 325 MG tablet Take 325 mg by mouth  daily.         No current facility-administered medications for this visit.    Allergies as of 02/08/2013  . (No Known Allergies)    Past Medical History  Diagnosis Date  . Hepatitis C     blood transfusion 1988  . Depression   . Degenerative disc disease, lumbar   . Spinal stenosis   . CVA (cerebral infarction)     per patient    Past Surgical History  Procedure Laterality Date  . Knee surgery Left     X5  . Rotator cuff repair Right   . Knee surgery Right   . Surgery on ears Bilateral   . Ankle surgery Right   . L4-l5 fusion      x2  . Kidney stone extraction  1989    Family History  Problem Relation Age of Onset  . Heart disease Mother   . Colon cancer Neg Hx   . Liver disease Neg Hx     History   Social History  . Marital Status: Married    Spouse Name: N/A    Number of Children: N/A  . Years of Education: N/A   Occupational History  . Not on file.   Social History Main Topics  . Smoking status: Current Every Day Smoker -- 1.00 packs/day for 40 years  . Smokeless tobacco: Not   on file  . Alcohol Use: No  . Drug Use: No  . Sexually Active: Not on file   Other Topics Concern  . Not on file   Social History Narrative  . No narrative on file      ROS:  General: Negative for anorexia, fever, chills, fatigue, weakness. See HPI. Eyes: Negative for vision changes.  ENT: Negative for hoarseness,   nasal congestion. See hpi. CV: Negative for chest pain, angina, palpitations, dyspnea on exertion, peripheral edema.  Respiratory: Negative for dyspnea at rest, dyspnea on exertion, cough, sputum, wheezing.  GI: See history of present illness. GU:  Negative for dysuria, hematuria, urinary incontinence, urinary frequency, nocturnal urination.  MS: Chronic knee and low back pain.  Derm: Negative for rash or itching.  Neuro: Negative for weakness, abnormal sensation, seizure, frequent headaches, memory loss, confusion.  Psych: Negative for anxiety,  depression, suicidal ideation, hallucinations.  Endo: See HPI.  Heme: Negative for bruising or bleeding. Allergy: Negative for rash or hives.    Physical Examination:  BP 109/70  Pulse 86  Temp(Src) 97.6 F (36.4 C) (Oral)  Ht 5' 11" (1.803 m)  Wt 213 lb 12.8 oz (96.979 kg)  BMI 29.83 kg/m2   General: Well-nourished, well-developed in no acute distress. Slightly hard of hearing.  Head: Normocephalic, atraumatic.   Eyes: Conjunctiva pink, no icterus. Mouth: Oropharyngeal mucosa moist and pink , no lesions erythema or exudate. Neck: Supple without thyromegaly, masses, or lymphadenopathy.  Lungs: Clear to auscultation bilaterally.  Heart: Regular rate and rhythm, no murmurs rubs or gallops.  Abdomen: Bowel sounds are normal, mild epigastric/ruq tenderness, negative Murphy's sign, nondistended, no hepatosplenomegaly or masses, no abdominal bruits or    hernia , no rebound or guarding.   Rectal: deferred Extremities: No lower extremity edema. No clubbing or deformities.  Neuro: Alert and oriented x 4 , grossly normal neurologically.  Skin: Warm and dry, no rash or jaundice.   Psych: Alert and cooperative, normal mood and affect.  Labs: Lab Results  Component Value Date   WBC 11.5* 01/21/2013   HGB 17.7* 01/21/2013   HCT 49.0 01/21/2013   MCV 88.8 01/21/2013   PLT 214 01/21/2013   Lab Results  Component Value Date   CREATININE 0.71 01/21/2013   BUN 16 01/21/2013   NA 137 01/21/2013   K 2.7* 01/21/2013   CL 97 01/21/2013   CO2 24 01/21/2013   Lab Results  Component Value Date   ALT 37 01/21/2013   AST 39* 01/21/2013   ALKPHOS 73 01/21/2013   BILITOT 1.1 01/21/2013   Lab Results  Component Value Date   LIPASE 19 01/21/2013     Imaging Studies: No results found.    

## 2013-02-08 NOTE — Assessment & Plan Note (Signed)
Recurrent N/V, epigastric pain/RUQ pain. Hospitalized at [facility] in 12/2012 and see in ED this month at [facility]. He is on chronic narcotics for spinal stenosis. ?underlying gastroparesis. Cannot rule out PUD/gastritis, etc. Recommend EGD for further evaluation. Due to chronic narcotics, plan on deep sedation in OR. Patient requested this route which I fully agree with.  I have discussed the risks, alternatives, benefits with regards to but not limited to the risk of reaction to medication, bleeding, infection, perforation and the patient is agreeable to proceed. Written consent to be obtained.  Obtain records from [facility] for review.

## 2013-02-08 NOTE — Assessment & Plan Note (Signed)
Colonoscopy with deep sedation in OR due to chronic narcotics.  I have discussed the risks, alternatives, benefits with regards to but not limited to the risk of reaction to medication, bleeding, infection, perforation and the patient is agreeable to proceed. Written consent to be obtained.

## 2013-02-08 NOTE — Patient Instructions (Addendum)
Shawn Russell  02/08/2013   Your procedure is scheduled on:  02/14/2013  Report to [facility] at  700  AM.  Call this number if you have problems the morning of surgery: [phone]   Remember:   Do not eat food or drink liquids after midnight.   Take these medicines the morning of surgery with A SIP OF WATER:  Valium,lexapro,morphine,oxymorphone   Do not wear jewelry, make-up or nail polish.  Do not wear lotions, powders, or perfumes.   Do not shave 48 hours prior to surgery. Men may shave face and neck.  Do not bring valuables to the hospital.  Contacts, dentures or bridgework may not be worn into surgery.  Leave suitcase in the car. After surgery it may be brought to your room.  For patients admitted to the hospital, checkout time is 11:00 AM the day of discharge.   Patients discharged the day of surgery will not be allowed to drive  home.  Name and phone number of your driver: family  Special Instructions: N/A   Please read over the following fact sheets that you were given: Pain Booklet, Coughing and Deep Breathing, Anesthesia Post-op Instructions and Care and Recovery After Surgery Colonoscopy A colonoscopy is an exam to evaluate your entire colon. In this exam, your colon is cleansed. A long fiberoptic tube is inserted through your rectum and into your colon. The fiberoptic scope (endoscope) is a long bundle of enclosed and very flexible fibers. These fibers transmit light to the area examined and send images from that area to your caregiver. Discomfort is usually minimal. You may be given a drug to help you sleep (sedative) during or prior to the procedure. This exam helps to detect lumps (tumors), polyps, inflammation, and areas of bleeding. Your caregiver may also take a small piece of tissue (biopsy) that will be examined under a microscope. LET YOUR CAREGIVER KNOW ABOUT:   Allergies to food or medicine.  Medicines taken, including vitamins, herbs, eyedrops,  over-the-counter medicines, and creams.  Use of steroids (by mouth or creams).  Previous problems with anesthetics or numbing medicines.  History of bleeding problems or blood clots.  Previous surgery.  Other health problems, including diabetes and kidney problems.  Possibility of pregnancy, if this applies. BEFORE THE PROCEDURE   A clear liquid diet may be required for 2 days before the exam.  Ask your caregiver about changing or stopping your regular medications.  Liquid injections (enemas) or laxatives may be required.  A large amount of electrolyte solution may be given to you to drink over a short period of time. This solution is used to clean out your colon.  You should be present 60 minutes prior to your procedure or as directed by your caregiver. AFTER THE PROCEDURE   If you received a sedative or pain relieving medication, you will need to arrange for someone to drive you home.  Occasionally, there is a little blood passed with the first bowel movement. Do not be concerned. FINDING OUT THE RESULTS OF YOUR TEST Not all test results are available during your visit. If your test results are not back during the visit, make an appointment with your caregiver to find out the results. Do not assume everything is normal if you have not heard from your caregiver or the medical facility. It is important for you to follow up on all of your test results. HOME CARE INSTRUCTIONS   It is not  unusual to pass moderate amounts of gas and experience mild abdominal cramping following the procedure. This is due to air being used to inflate your colon during the exam. Walking or a warm pack on your belly (abdomen) may help.  You may resume all normal meals and activities after sedatives and medicines have worn off.  Only take over-the-counter or prescription medicines for pain, discomfort, or fever as directed by your caregiver. Do not use aspirin or blood thinners if a biopsy was taken.  Consult your caregiver for medicine usage if biopsies were taken. SEEK IMMEDIATE MEDICAL CARE IF:   You have a fever.  You pass large blood clots or fill a toilet with blood following the procedure. This may also occur 10 to 14 days following the procedure. This is more likely if a biopsy was taken.  You develop abdominal pain that keeps getting worse and cannot be relieved with medicine. Document Released: 09/30/2000 Document Revised: 12/26/2011 Document Reviewed: 05/15/2008 [facility] Patient Information 2013 [place], [place]. Esophageal Dilatation The esophagus is the long, narrow tube which carries food and liquid from the mouth to the stomach. Esophageal dilatation is the technique used to stretch a blocked or narrowed portion of the esophagus. This procedure is used when a part of the esophagus has become so narrow that it becomes difficult, painful or even impossible to swallow. This is generally an uncomplicated form of treatment. When this is not successful, chest surgery may be required. This is a much more extensive form of treatment with a longer recovery time. CAUSES  Some of the more common causes of blockage or strictures of the esophagus are:  Narrowing from longstanding inflammation (soreness and redness) of the lower esophagus. This comes from the constant exposure of the lower esophagus to the acid which bubbles up from the stomach. Over time this causes scarring and narrowing of the lower esophagus.  Hiatal hernia in which a small part of the stomach bulges (herniates) up through the diaphragm. This can cause a gradual narrowing of the end of the esophagus.  Schatzki's Ring is a narrow ring of benign (non-cancerous) fibrous tissue which constricts the lower esophagus. The reason for this is not known.  Scleroderma is a connective tissue disorder that affects the esophagus and makes swallowing difficult.  Achalasia is an absence of nerves to the lower esophagus and to the  esophageal sphincter. This is the circular muscle between the stomach and esophagus that relaxes to allow food into the stomach. After swallowing, it contracts to keep food in the stomach. This absence of nerves may be congenital (present since birth). This can cause irregular spasms of the lower esophageal muscle. This spasm does not open up to allow food and fluid through. The result is a persistent blockage with subsequent slow trickling of the esophageal contents into the stomach.  Strictures may develop from swallowing materials which damage the esophagus. Some examples are strong acids or alkalis such as lye.  Growths such as benign (non-cancerous) and malignant (cancerous) tumors can block the esophagus.  Heredity (present since birth) causes. DIAGNOSIS  Your caregiver often suspects this problem by taking a medical history. They will also do a physical exam. They can then prove their suspicions using X-rays and endoscopy. Endoscopy is an exam in which a tube like a small flexible telescope is used to look at your esophagus.  TREATMENT There are different stretching (dilating) techniques which can be used. Simple bougie dilatation may be done in the office. This usually takes only  a couple minutes. A numbing (anesthetic) spray of the throat is used. Endoscopy, when done, is done in an endoscopy suite, under mild sedation. When fluoroscopy is used, the procedure is performed in X-ray. Other techniques require a little longer time. Recovery is usually quick. There is no waiting time to begin eating and drinking to test success of the treatment. Following are some of the methods used. Narrowing of the esophagus is treated by making it bigger. Commonly this is a mechanical problem which can be treated with stretching. This can be done in different ways. Your caregiver will discuss these with you. Some of the means used are:  A series of graduated (increasing thickness) flexible dilators can be used.  These are weighted tubes passed through the esophagus into the stomach. The tubes used become progressively larger until the desired stretched size is reached. Graduated dilators are a simple and quick way of opening the esophagus. No visualization is required.  Another method is the use of endoscopy to place a flexible wire across the stricture. The endoscope is removed and the wire left in place. A dilator with a hole through it from end to end is guided down the esophagus and across the stricture. One or more of these dilators are passed over the wire. At the end of the exam, the wire is removed. This type of treatment may be performed in the X-ray department under fluoroscopy. An advantage of this procedure is the examiner is visualizing the end opening in the esophagus.  Stretching of the esophagus may be done using balloons. Deflated balloons are placed through the endoscope and across the stricture. This type of balloon dilatation is often done at the time of endoscopy or fluoroscopy. Flexible endoscopy allows the examiner to directly view the stricture. A balloon is inserted in the deflated form into the area of narrowing. It is then inflated with air to a certain pressure that is pre-set for a given circumference. When inflated, it becomes sausage shaped, stretched, and makes the stricture larger.  Achalasia requires a longer larger balloon-type dilator. This is frequently done under X-ray control. In this situation, the spastic muscle fibers in the lower esophagus are stretched. All of the above procedures make the passage of food and water into the stomach easier. They also make it easier for stomach contents to reflux back into the esophagus. Special medications may be used following the procedure to help prevent further stricturing. Proton-pump inhibitor medications are good at decreasing the amount of acid in the stomach juice. When stomach juice refluxes into the esophagus, the juice is no  longer as acidic and is less likely to burn or scar the esophagus. RISKS AND COMPLICATIONS Esophageal dilatation is usually performed effectively and without problems. Some complications that can occur are:  A small amount of bleeding almost always happens where the stretching takes place. If this is too excessive it may require more aggressive treatment.  An uncommon complication is perforation (making a hole) of the esophagus. The esophagus is thin. It is easy to make a hole in it. If this happens, an operation may be necessary to repair this.  A small, undetected perforation could lead to an infection in the chest. This can be very serious. HOME CARE INSTRUCTIONS   If you received sedation for your procedure, do not drive, make important decisions, or perform any activities requiring your full coordination. Do not drink alcohol, take sedatives, or use any mind altering chemicals unless instructed by your caregiver.  You  may use throat lozenges or warm salt water gargles if you have throat discomfort  You can begin eating and drinking normally on return home unless instructed otherwise. Do not purposely try to force large chunks of food down to test the benefits of your procedure.  Mild discomfort can be eased with sips of ice water.  Medications for discomfort may or may not be needed. SEEK IMMEDIATE MEDICAL CARE IF:   You begin vomiting up blood.  You develop black tarry stools  You develop chills or an unexplained temperature of over 101 F (38.3 C)  You develop chest or abdominal pain.  You develop shortness of breath or feel lightheaded or faint.  Your swallowing is becoming more painful, difficult, or you are unable to swallow. MAKE SURE YOU:   Understand these instructions.  Will watch your condition.  Will get help right away if you are not doing well or get worse. Document Released: 11/24/2005 Document Revised: 12/26/2011 Document Reviewed: 01/11/2006 [facility]  Patient Information 2013 [place], [place]. Esophagogastroduodenoscopy This is an endoscopic procedure (a procedure that uses a device like a flexible telescope) that allows your caregiver to view the upper stomach and small bowel. This test allows your caregiver to look at the esophagus. The esophagus carries food from your mouth to your stomach. They can also look at your duodenum. This is the first part of the small intestine that attaches to the stomach. This test is used to detect problems in the bowel such as ulcers and inflammation. PREPARATION FOR TEST Nothing to eat after midnight the day before the test. NORMAL FINDINGS Normal esophagus, stomach, and duodenum. Ranges for normal findings may vary among different laboratories and hospitals. You should always check with your doctor after having lab work or other tests done to discuss the meaning of your test results and whether your values are considered within normal limits. MEANING OF TEST  Your caregiver will go over the test results with you and discuss the importance and meaning of your results, as well as treatment options and the need for additional tests if necessary. OBTAINING THE TEST RESULTS It is your responsibility to obtain your test results. Ask the lab or department performing the test when and how you will get your results. Document Released: 02/03/2005 Document Revised: 12/26/2011 Document Reviewed: 09/12/2008 [facility] Patient Information 2013 [place], [place]. PATIENT INSTRUCTIONS POST-ANESTHESIA  IMMEDIATELY FOLLOWING SURGERY:  Do not drive or operate machinery for the first twenty four hours after surgery.  Do not make any important decisions for twenty four hours after surgery or while taking narcotic pain medications or sedatives.  If you develop intractable nausea and vomiting or a severe headache please notify your doctor immediately.  FOLLOW-UP:  Please make an appointment with your surgeon as instructed. You do not  need to follow up with anesthesia unless specifically instructed to do so.  WOUND CARE INSTRUCTIONS (if applicable):  Keep a dry clean dressing on the anesthesia/puncture wound site if there is drainage.  Once the wound has quit draining you may leave it open to air.  Generally you should leave the bandage intact for twenty four hours unless there is drainage.  If the epidural site drains for more than 36-48 hours please call the anesthesia department.  QUESTIONS?:  Please feel free to call your physician or the hospital operator if you have any questions, and they will be happy to assist you.

## 2013-02-11 ENCOUNTER — Other Ambulatory Visit: Payer: Self-pay

## 2013-02-11 ENCOUNTER — Encounter (HOSPITAL_COMMUNITY)
Admission: RE | Admit: 2013-02-11 | Discharge: 2013-02-11 | Disposition: A | Discharge status: Home / Self Care | Payer: PRIVATE HEALTH INSURANCE | Source: Ambulatory Visit | Attending: Internal Medicine | Admitting: Internal Medicine

## 2013-02-11 ENCOUNTER — Encounter (HOSPITAL_COMMUNITY): Payer: Self-pay

## 2013-02-11 LAB — BASIC METABOLIC PANEL
BUN: 8 mg/dL (ref 6–23)
CO2: 32 mEq/L (ref 19–32)
Chloride: 98 mEq/L (ref 96–112)
Creatinine, Ser: 0.83 mg/dL (ref 0.50–1.35)
GFR calc Af Amer: >90 mL/min (ref >90)
Glucose, Bld: 100 mg/dL — ABNORMAL HIGH (ref 70–99)
Potassium: 4.6 mEq/L (ref 3.5–5.1)

## 2013-02-11 NOTE — Progress Notes (Signed)
Cc PCP 

## 2013-02-14 ENCOUNTER — Ambulatory Visit (HOSPITAL_COMMUNITY)
Admission: RE | Admit: 2013-02-14 | Discharge: 2013-02-14 | Disposition: A | Discharge status: Home / Self Care | Payer: PRIVATE HEALTH INSURANCE | Source: Ambulatory Visit | Attending: Internal Medicine | Admitting: Internal Medicine

## 2013-02-14 ENCOUNTER — Ambulatory Visit (HOSPITAL_COMMUNITY): Payer: PRIVATE HEALTH INSURANCE | Admitting: Anesthesiology

## 2013-02-14 ENCOUNTER — Encounter (HOSPITAL_COMMUNITY)
Admission: RE | Disposition: A | Discharge status: Home / Self Care | Payer: Self-pay | Source: Ambulatory Visit | Attending: Internal Medicine

## 2013-02-14 ENCOUNTER — Encounter (HOSPITAL_COMMUNITY): Payer: Self-pay | Admitting: *Deleted

## 2013-02-14 ENCOUNTER — Encounter (HOSPITAL_COMMUNITY): Payer: Self-pay | Admitting: Anesthesiology

## 2013-02-14 DIAGNOSIS — D126 Benign neoplasm of colon, unspecified: Secondary | ICD-10-CM | POA: Insufficient documentation

## 2013-02-14 DIAGNOSIS — K921 Melena: Secondary | ICD-10-CM | POA: Insufficient documentation

## 2013-02-14 DIAGNOSIS — R1011 Right upper quadrant pain: Secondary | ICD-10-CM

## 2013-02-14 DIAGNOSIS — K222 Esophageal obstruction: Secondary | ICD-10-CM | POA: Insufficient documentation

## 2013-02-14 DIAGNOSIS — Z79899 Other long term (current) drug therapy: Secondary | ICD-10-CM | POA: Insufficient documentation

## 2013-02-14 DIAGNOSIS — R1310 Dysphagia, unspecified: Secondary | ICD-10-CM

## 2013-02-14 DIAGNOSIS — K621 Rectal polyp: Secondary | ICD-10-CM

## 2013-02-14 DIAGNOSIS — Z01812 Encounter for preprocedural laboratory examination: Secondary | ICD-10-CM | POA: Insufficient documentation

## 2013-02-14 DIAGNOSIS — R933 Abnormal findings on diagnostic imaging of other parts of digestive tract: Secondary | ICD-10-CM

## 2013-02-14 DIAGNOSIS — D128 Benign neoplasm of rectum: Secondary | ICD-10-CM | POA: Insufficient documentation

## 2013-02-14 DIAGNOSIS — K449 Diaphragmatic hernia without obstruction or gangrene: Secondary | ICD-10-CM | POA: Insufficient documentation

## 2013-02-14 DIAGNOSIS — K620 Anal polyp: Secondary | ICD-10-CM

## 2013-02-14 DIAGNOSIS — D129 Benign neoplasm of anus and anal canal: Secondary | ICD-10-CM | POA: Insufficient documentation

## 2013-02-14 DIAGNOSIS — R110 Nausea: Secondary | ICD-10-CM | POA: Insufficient documentation

## 2013-02-14 HISTORY — PX: COLONOSCOPY WITH PROPOFOL: SHX5780

## 2013-02-14 HISTORY — PX: MALONEY DILATION: SHX5535

## 2013-02-14 HISTORY — PX: ESOPHAGOGASTRODUODENOSCOPY (EGD) WITH PROPOFOL: SHX5813

## 2013-02-14 SURGERY — DILATION, ESOPHAGUS, USING MALONEY DILATOR
Anesthesia: Monitor Anesthesia Care | Site: Rectum

## 2013-02-14 MED ORDER — ONDANSETRON HCL 4 MG/2ML IJ SOLN
INTRAMUSCULAR | Status: AC
  Filled 2013-02-14: qty 2

## 2013-02-14 MED ORDER — FENTANYL CITRATE 0.05 MG/ML IJ SOLN
INTRAMUSCULAR | Status: AC
  Filled 2013-02-14: qty 2

## 2013-02-14 MED ORDER — MIDAZOLAM HCL 2 MG/2ML IJ SOLN
1.0000 mg | INTRAMUSCULAR | Status: DC | PRN
  Administered 2013-02-14 (×2): 2 mg via INTRAVENOUS

## 2013-02-14 MED ORDER — FENTANYL CITRATE 0.05 MG/ML IJ SOLN
25.0000 ug | INTRAMUSCULAR | Status: DC | PRN
  Administered 2013-02-14 (×2): 25 ug via INTRAVENOUS

## 2013-02-14 MED ORDER — PROPOFOL 10 MG/ML IV EMUL
INTRAVENOUS | Status: AC
  Filled 2013-02-14: qty 20

## 2013-02-14 MED ORDER — PROPOFOL INFUSION 10 MG/ML OPTIME
INTRAVENOUS | Status: DC | PRN
  Administered 2013-02-14 (×2): 75 ug/kg/min via INTRAVENOUS

## 2013-02-14 MED ORDER — GLYCOPYRROLATE 0.2 MG/ML IJ SOLN
INTRAMUSCULAR | Status: AC
  Filled 2013-02-14: qty 1

## 2013-02-14 MED ORDER — LACTATED RINGERS IV SOLN
INTRAVENOUS | Status: DC
  Administered 2013-02-14 (×2): via INTRAVENOUS

## 2013-02-14 MED ORDER — LIDOCAINE HCL (CARDIAC) 10 MG/ML IV SOLN
INTRAVENOUS | Status: DC | PRN
  Administered 2013-02-14: 50 mg via INTRAVENOUS

## 2013-02-14 MED ORDER — LIDOCAINE HCL (PF) 1 % IJ SOLN
INTRAMUSCULAR | Status: AC
  Filled 2013-02-14: qty 5

## 2013-02-14 MED ORDER — MIDAZOLAM HCL 2 MG/2ML IJ SOLN
INTRAMUSCULAR | Status: AC
  Filled 2013-02-14: qty 2

## 2013-02-14 MED ORDER — BUTAMBEN-TETRACAINE-BENZOCAINE 2-2-14 % EX AERO
INHALATION_SPRAY | CUTANEOUS | Status: DC | PRN
  Administered 2013-02-14: 2 via TOPICAL

## 2013-02-14 MED ORDER — STERILE WATER FOR IRRIGATION IR SOLN
Status: DC | PRN
  Administered 2013-02-14: 09:00:00

## 2013-02-14 MED ORDER — GLYCOPYRROLATE 0.2 MG/ML IJ SOLN
0.2000 mg | Freq: Once | INTRAMUSCULAR | Status: AC
  Administered 2013-02-14: 0.2 mg via INTRAVENOUS

## 2013-02-14 MED ORDER — ONDANSETRON HCL 4 MG/2ML IJ SOLN
4.0000 mg | Freq: Once | INTRAMUSCULAR | Status: AC
  Administered 2013-02-14: 4 mg via INTRAVENOUS

## 2013-02-14 MED ORDER — FENTANYL CITRATE 0.05 MG/ML IJ SOLN
INTRAMUSCULAR | Status: DC | PRN
  Administered 2013-02-14 (×4): 25 ug via INTRAVENOUS

## 2013-02-14 SURGICAL SUPPLY — 17 items
BLOCK BITE 60FR ADLT L/F BLUE (MISCELLANEOUS) ×4 IMPLANT
DEVICE CLIP HEMOSTAT 235CM (CLIP) IMPLANT
ELECT REM PT RETURN 9FT ADLT (ELECTROSURGICAL)
ELECTRODE REM PT RTRN 9FT ADLT (ELECTROSURGICAL) IMPLANT
FLOOR PAD 36X40 (MISCELLANEOUS) ×4
FORCEPS BIOP RAD 4 LRG CAP 4 (CUTTING FORCEPS) ×4 IMPLANT
MANIFOLD NEPTUNE II (INSTRUMENTS) ×4 IMPLANT
NEEDLE SCLEROTHERAPY 25GX240 (NEEDLE) IMPLANT
PAD FLOOR 36X40 (MISCELLANEOUS) ×3 IMPLANT
PROBE APC STR FIRE (PROBE) IMPLANT
PROBE INJECTION GOLD (MISCELLANEOUS)
PROBE INJECTION GOLD 7FR (MISCELLANEOUS) IMPLANT
SNARE ROTATE MED OVAL 20MM (MISCELLANEOUS) ×4 IMPLANT
SYR 50ML LL SCALE MARK (SYRINGE) ×4 IMPLANT
TUBING ENDO SMARTCAP PENTAX (MISCELLANEOUS) ×4 IMPLANT
TUBING IRRIGATION ENDOGATOR (MISCELLANEOUS) ×4 IMPLANT
WATER STERILE IRR 1000ML POUR (IV SOLUTION) ×4 IMPLANT

## 2013-02-14 NOTE — H&P (View-Only) (Signed)
Primary Care Physician:  Rudi Heap, MD  Primary Gastroenterologist:  Roetta Sessions, MD   Chief Complaint  Patient presents with  . Nausea  . Emesis  . Abdominal Pain    HPI:  Shawn Russell is a 52 y.o. male here for further evaluation of recurrent N/V, epigastric/ruq pain. He has h/o chronic HCV [facility] [place] until last year. He reports he was never contacted about follow up visit. There has been restructuring of the clinic since his last visit there in 04/2012. 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 He was treated with pegylated interferon, telaprevir, ribavirin but was a relapser. He is on chronic narcotic for spinal stenosis, managed through pain clinic.   He reports intermittent N/V, abd pain since 10/2012. Initially thought he had stomach virus and/or related to pain medications. Never had problem with pain meds before except constipation. Controlled with high fiber diet. Usually two meals per day. Hospitalized at [facility] for two days in 12/2012, N/V/D. Then to ED at [facility] couple of weeks ago for N/V/Epig/ruq pain. Last vomited couple of weeks ago.   Denies heartburn. Some dysphagia, solid food occasionally. No pill dysphagia. BM formed but not hard. QOD. Some brbpr when had diarrhea several months ago. No melena. He reports that he lost 40 pounds since this started in 10/2012. He has gained about 13 pounds back per his report. Used to weight 240lbs.    Stopped ASA on his own earlier this week. No other NSAIDS.  Current Outpatient Prescriptions  Medication Sig Dispense Refill  . diazepam (VALIUM) 10 MG tablet Take 10 mg by mouth 3 (three) times daily.       Marland Kitchen escitalopram (LEXAPRO) 10 MG tablet Take 20 mg by mouth daily.       . MORPHINE SULFATE ER PO Take 60 mg by mouth 3 (three) times daily.      Marland Kitchen oxymorphone (OPANA) 5 MG tablet Take 5 mg by mouth every 8 (eight) hours as needed for pain.      Marland Kitchen aspirin 325 MG tablet Take 325 mg by mouth  daily.         No current facility-administered medications for this visit.    Allergies as of 02/08/2013  . (No Known Allergies)    Past Medical History  Diagnosis Date  . Hepatitis C     blood transfusion 1988  . Depression   . Degenerative disc disease, lumbar   . Spinal stenosis   . CVA (cerebral infarction)     per patient    Past Surgical History  Procedure Laterality Date  . Knee surgery Left     X5  . Rotator cuff repair Right   . Knee surgery Right   . Surgery on ears Bilateral   . Ankle surgery Right   . L4-l5 fusion      x2  . Kidney stone extraction  1989    Family History  Problem Relation Age of Onset  . Heart disease Mother   . Colon cancer Neg Hx   . Liver disease Neg Hx     History   Social History  . Marital Status: Married    Spouse Name: N/A    Number of Children: N/A  . Years of Education: N/A   Occupational History  . Not on file.   Social History Main Topics  . Smoking status: Current Every Day Smoker -- 1.00 packs/day for 40 years  . Smokeless tobacco: Not  on file  . Alcohol Use: No  . Drug Use: No  . Sexually Active: Not on file   Other Topics Concern  . Not on file   Social History Narrative  . No narrative on file      ROS:  General: Negative for anorexia, fever, chills, fatigue, weakness. See HPI. Eyes: Negative for vision changes.  ENT: Negative for hoarseness,   nasal congestion. See hpi. CV: Negative for chest pain, angina, palpitations, dyspnea on exertion, peripheral edema.  Respiratory: Negative for dyspnea at rest, dyspnea on exertion, cough, sputum, wheezing.  GI: See history of present illness. GU:  Negative for dysuria, hematuria, urinary incontinence, urinary frequency, nocturnal urination.  MS: Chronic knee and low back pain.  Derm: Negative for rash or itching.  Neuro: Negative for weakness, abnormal sensation, seizure, frequent headaches, memory loss, confusion.  Psych: Negative for anxiety,  depression, suicidal ideation, hallucinations.  Endo: See HPI.  Heme: Negative for bruising or bleeding. Allergy: Negative for rash or hives.    Physical Examination:  BP 109/70  Pulse 86  Temp(Src) 97.6 F (36.4 C) (Oral)  Ht 5\' 11"  (1.803 m)  Wt 213 lb 12.8 oz (96.979 kg)  BMI 29.83 kg/m2   General: Well-nourished, well-developed in no acute distress. Slightly hard of hearing.  Head: Normocephalic, atraumatic.   Eyes: Conjunctiva pink, no icterus. Mouth: Oropharyngeal mucosa moist and pink , no lesions erythema or exudate. Neck: Supple without thyromegaly, masses, or lymphadenopathy.  Lungs: Clear to auscultation bilaterally.  Heart: Regular rate and rhythm, no murmurs rubs or gallops.  Abdomen: Bowel sounds are normal, mild epigastric/ruq tenderness, negative Murphy's sign, nondistended, no hepatosplenomegaly or masses, no abdominal bruits or    hernia , no rebound or guarding.   Rectal: deferred Extremities: No lower extremity edema. No clubbing or deformities.  Neuro: Alert and oriented x 4 , grossly normal neurologically.  Skin: Warm and dry, no rash or jaundice.   Psych: Alert and cooperative, normal mood and affect.  Labs: Lab Results  Component Value Date   WBC 11.5* 01/21/2013   HGB 17.7* 01/21/2013   HCT 49.0 01/21/2013   MCV 88.8 01/21/2013   PLT 214 01/21/2013   Lab Results  Component Value Date   CREATININE 0.71 01/21/2013   BUN 16 01/21/2013   NA 137 01/21/2013   K 2.7* 01/21/2013   CL 97 01/21/2013   CO2 24 01/21/2013   Lab Results  Component Value Date   ALT 37 01/21/2013   AST 39* 01/21/2013   ALKPHOS 73 01/21/2013   BILITOT 1.1 01/21/2013   Lab Results  Component Value Date   LIPASE 19 01/21/2013     Imaging Studies: No results found.

## 2013-02-14 NOTE — Op Note (Signed)
[facility] [address] [place], [address]   COLONOSCOPY PROCEDURE REPORT  PATIENT: Shawn Russell, Shawn Russell  MR#:         [id number] BIRTHDATE: [birth year] , 51  yrs. old GENDER: Male ENDOSCOPIST: R.  Roetta Sessions, MD FACP FACG REFERRED BY:  Rudi Heap, M.D. PROCEDURE DATE:  02/14/2013 PROCEDURE:     Colonoscopy with snare polypectomy and biopsy  INDICATIONS: Paper hematochezia  INFORMED CONSENT:  The risks, benefits, alternatives and imponderables including but not limited to bleeding, perforation as well as the possibility of a missed lesion have been reviewed.  The potential for biopsy, lesion removal, etc. have also been discussed.  Questions have been answered.  All parties agreeable. Please see the history and physical in the medical record for more information.  MEDICATIONS: Deep sedation per Dr. [facility]  DESCRIPTION OF PROCEDURE:  After a digital rectal exam was performed, the     colonoscope was advanced from the anus through the rectum and colon to the area of the cecum, ileocecal valve and appendiceal orifice.  The cecum was deeply intubated.  These structures were well-seen and photographed for the record.  From the level of the cecum and ileocecal valve, the scope was slowly and cautiously withdrawn.  The mucosal surfaces were carefully surveyed utilizing scope tip deflection to facilitate fold flattening as needed.  The scope was pulled down into the rectum where a thorough examination including retroflexion was performed.     FINDINGS:  Adequate preparation. (2) diminutive polyps just inside the anal verge-2 cm from the anal verge. The rectal mucosa otherwise appeared normal. (1) flat 8 mm polyp in the mid sigmoid segment; otherwise, the remainder of colonic mucosa appeared normal.  THERAPEUTIC / DIAGNOSTIC MANEUVERS PERFORMED:  The sigmoid polyp was hot snare removed. The 2 rectal polyps were cold  biopsied/removed  COMPLICATIONS: none  CECAL WITHDRAWAL TIME:  11 minutes  IMPRESSION:  Rectal and colonic polyps-removed as described above  RECOMMENDATIONS: Follow up on pathology. Further recommendations to follow.  See EGD report   _______________________________ eSigned:  R. Roetta Sessions, MD FACP [facility] 02/14/2013 10:09 AM   CC:    PATIENT NAME:  Josedaniel, Haye MR#: [id number]

## 2013-02-14 NOTE — Anesthesia Postprocedure Evaluation (Signed)
  Anesthesia Post-op Note  Patient: Shawn Russell  Procedure(s) Performed: Procedure(s) with comments: MALONEY DILATION (N/A) ESOPHAGOGASTRODUODENOSCOPY (EGD) WITH PROPOFOL (N/A) COLONOSCOPY WITH PROPOFOL (N/A) - At cecum:09:16,  cecal withdrawal time:0930, total cecal time:14 minutes  Patient Location: PACU  Anesthesia Type:MAC  Level of Consciousness: awake, alert , oriented and patient cooperative  Airway and Oxygen Therapy: Patient Spontanous Breathing and Patient connected to face mask oxygen  Post-op Pain: none  Post-op Assessment: Post-op Vital signs reviewed, Patient's Cardiovascular Status Stable, Respiratory Function Stable and Patent Airway  Post-op Vital Signs: Reviewed and stable  Complications: No apparent anesthesia complications

## 2013-02-14 NOTE — Transfer of Care (Signed)
Immediate Anesthesia Transfer of Care Note  Patient: Shawn Russell  Procedure(s) Performed: Procedure(s) with comments: MALONEY DILATION (N/A) ESOPHAGOGASTRODUODENOSCOPY (EGD) WITH PROPOFOL (N/A) COLONOSCOPY WITH PROPOFOL (N/A) - At cecum:09:16,  cecal withdrawal time:0930, total cecal time:14 minutes  Patient Location: PACU  Anesthesia Type:MAC  Level of Consciousness: sedated and patient cooperative  Airway & Oxygen Therapy: Patient Spontanous Breathing and Patient connected to face mask oxygen  Post-op Assessment: Report given to PACU RN and Post -op Vital signs reviewed and stable  Post vital signs: Reviewed and stable  Complications: No apparent anesthesia complications

## 2013-02-14 NOTE — Anesthesia Procedure Notes (Addendum)
Performed by: Corena Pilgrim L    Procedure Name: MAC Date/Time: 02/14/2013 8:45 AM Performed by: Carolyne Littles, AMY L Pre-anesthesia Checklist: Patient identified, Timeout performed, Emergency Drugs available, Suction available and Patient being monitored Oxygen Delivery Method: Non-rebreather mask

## 2013-02-14 NOTE — Op Note (Signed)
[facility] [address] [place], [address]   ENDOSCOPY PROCEDURE REPORT  PATIENT: Shawn, Russell  MR#: [id number] BIRTHDATE: [birth year] , 51  yrs. old GENDER: Male ENDOSCOPIST: R.  Roetta Sessions, MD FACP FACG REFERRED BY:  Rudi Heap, M.D. PROCEDURE DATE:  02/14/2013 PROCEDURE:     EGD with Elease Hashimoto dilation, gastric biopsy  INDICATIONS:      Esophageal dysphagia; right upper quadrant abdominal pain and nausea  INFORMED CONSENT:   The risks, benefits, limitations, alternatives and imponderables have been discussed.  The potential for biopsy, esophogeal dilation, etc. have also been reviewed.  Questions have been answered.  All parties agreeable.  Please see the history and physical in the medical record for more information.  MEDICATIONS:  Deep sedation per Dr. [facility]  DESCRIPTION OF PROCEDURE:   The     endoscope was introduced through the mouth and advanced to the second portion of the duodenum without difficulty or limitations.  The mucosal surfaces were surveyed very carefully during advancement of the scope and upon withdrawal.  Retroflexion view of the proximal stomach and esophagogastric junction was performed.      FINDINGS: Noncritical Schatzki's ring; otherwise normal-appearing esophageal mucosa. Stomach empty. Small hiatal hernia. Numerous antral and prepyloric erosions with mottling of the gastric mucosa diffusely. No ulcer or infiltrating process.Patent pylorus.Normal first and second portion of the duodenum  THERAPEUTIC / DIAGNOSTIC MANEUVERS PERFORMED:  A 56 French Maloney dilator was passed to full insertion easily. A look back revealed no apparent complication related to this maneuver. Subsequently, the abnormal gastric mucosa was biopsied for histologic study.   COMPLICATIONS:  None  IMPRESSION:   Schatzki's ring-dilated as described above. Hiatal hernia. Abnormal gastric mucosa-status post  biopsy  RECOMMENDATIONS:   Course of Protonix 40 mg daily. Followup on pathology. See colonoscopy report.    _______________________________ R. Roetta Sessions, MD FACP [facility] eSigned:  R. Roetta Sessions, MD FACP [facility] 02/14/2013 10:05 AM     CC:  PATIENT NAME:  Shawn, Russell MR#: [id number]

## 2013-02-14 NOTE — Anesthesia Preprocedure Evaluation (Signed)
Anesthesia Evaluation  Patient identified by MRN, date of birth, ID band Patient awake    Reviewed: Allergy & Precautions, H&P , NPO status , Patient's Chart, lab work & pertinent test results  Airway Mallampati: I TM Distance: >3 FB     Dental  (+) Partial Lower   Pulmonary Sleep apnea: am cough. , Current Smoker,  breath sounds clear to auscultation        Cardiovascular negative cardio ROS  Rhythm:Regular Rate:Normal     Neuro/Psych PSYCHIATRIC DISORDERS Depression  Neuromuscular disease CVA, No Residual Symptoms    GI/Hepatic (+) Hepatitis -, C  Endo/Other    Renal/GU      Musculoskeletal  (+) Arthritis - (L4-L5 Fusion, sciatica,  chronic LBP),   Abdominal   Peds  Hematology   Anesthesia Other Findings   Reproductive/Obstetrics                           Anesthesia Physical Anesthesia Plan  ASA: III  Anesthesia Plan: MAC   Post-op Pain Management:    Induction: Intravenous  Airway Management Planned: Simple Face Mask  Additional Equipment:   Intra-op Plan:   Post-operative Plan:   Informed Consent: I have reviewed the patients History and Physical, chart, labs and discussed the procedure including the risks, benefits and alternatives for the proposed anesthesia with the patient or authorized representative who has indicated his/her understanding and acceptance.     Plan Discussed with:   Anesthesia Plan Comments:         Anesthesia Quick Evaluation

## 2013-02-14 NOTE — Interval H&P Note (Signed)
History and Physical Interval Note:  02/14/2013 8:35 AM  Shawn Russell  has presented today for surgery, with the diagnosis of N/V, DYSPHAGIA,EPIGASTRIC PAIN, RUQ ABD PAIN  The various methods of treatment have been discussed with the patient and family. After consideration of risks, benefits and other options for treatment, the patient has consented to  Procedure(s): COLONOSCOPY WITH ESOPHAGOGASTRODUODENOSCOPY (EGD) (N/A) MALONEY DILATION (N/A) as a surgical intervention .  The patient's history has been reviewed, patient examined, no change in status, stable for     surgery.  I have reviewed the patient's chart and labs.  Questions were answered to the patient's satisfaction.       Hematochezia right upper quadrant pain nausea and vomiting. Intermittent esophageal dysphagia to solids. EGD with dilation as appropriate as well as colonoscopy today per plan.The risks, benefits, limitations, imponderables and alternatives regarding both EGD and colonoscopy have been reviewed with the patient. Questions have been answered. All parties agreeable.    Shawn Russell

## 2013-02-15 ENCOUNTER — Encounter (HOSPITAL_COMMUNITY): Payer: Self-pay | Admitting: Internal Medicine

## 2013-02-18 ENCOUNTER — Encounter: Payer: Self-pay | Admitting: Internal Medicine

## 2013-03-27 ENCOUNTER — Telehealth: Payer: Self-pay | Admitting: Gastroenterology

## 2013-03-27 NOTE — Telephone Encounter (Signed)
We received a letter from [facility] stating that the patient declined appointment with them for chronic hepatitis C after we referred him there.  Please let patient know, even if he does not want to be treated for the hepatitis C, he still needs to have periodic labs and ultrasound to screen for hepatoma.  Recommend office visit here for that purpose in the next 3-4 months.

## 2013-03-27 NOTE — Telephone Encounter (Signed)
Can we make sure he did not decline appt b/c he was referred to [facility] clinic. Make sure he was referred to the [facility] clinic.

## 2013-03-28 NOTE — Telephone Encounter (Signed)
I have sent new referral to [facility]

## 2013-04-08 NOTE — Telephone Encounter (Signed)
Dr. Jacqualine Mau is in [place] only, Mr. Lofstrom does NOT want to travel there either he wants the referral to be in [place] and that is what I have done.

## 2013-04-08 NOTE — Telephone Encounter (Signed)
Spoke with pt- he said he declined d/t it was in [place] and he didn't want to drive that far. He stated he has already been in a case study for his hep B and C with Dr. Jacqualine Mau in [facility], this doctor has all of his information already  and would like to be referred there.  Benedetto Goad, please refer.

## 2013-07-29 ENCOUNTER — Telehealth: Payer: Self-pay

## 2013-07-29 NOTE — Telephone Encounter (Signed)
Needs a referral to my liver dr. [facility] [address] [place]  Phone # [phone]

## 2013-07-29 NOTE — Telephone Encounter (Signed)
Need name of doctor to do referral

## 2013-07-30 ENCOUNTER — Other Ambulatory Visit: Payer: Self-pay | Admitting: Nurse Practitioner

## 2013-07-30 ENCOUNTER — Telehealth: Payer: Self-pay | Admitting: Nurse Practitioner

## 2013-07-30 DIAGNOSIS — B1920 Unspecified viral hepatitis C without hepatic coma: Secondary | ICD-10-CM

## 2013-07-30 NOTE — Telephone Encounter (Signed)
Referral made 

## 2013-07-30 NOTE — Telephone Encounter (Signed)
Still need dr name to make referral

## 2013-07-30 NOTE — Telephone Encounter (Signed)
DR. Darla Lesches [phone]

## 2013-07-30 NOTE — Telephone Encounter (Signed)
He goes to the [facility]  On [employer]

## 2014-01-23 ENCOUNTER — Emergency Department (HOSPITAL_COMMUNITY): Payer: BC Managed Care – PPO

## 2014-01-23 ENCOUNTER — Encounter (HOSPITAL_COMMUNITY): Payer: Self-pay | Admitting: Emergency Medicine

## 2014-01-23 ENCOUNTER — Emergency Department (HOSPITAL_COMMUNITY)
Admission: EM | Admit: 2014-01-23 | Discharge: 2014-01-23 | Disposition: A | Discharge status: Home / Self Care | Payer: BC Managed Care – PPO | Attending: Emergency Medicine | Admitting: Emergency Medicine

## 2014-01-23 DIAGNOSIS — M549 Dorsalgia, unspecified: Secondary | ICD-10-CM | POA: Diagnosis not present

## 2014-01-23 DIAGNOSIS — R0789 Other chest pain: Secondary | ICD-10-CM

## 2014-01-23 DIAGNOSIS — F19939 Other psychoactive substance use, unspecified with withdrawal, unspecified: Secondary | ICD-10-CM | POA: Insufficient documentation

## 2014-01-23 DIAGNOSIS — Z8619 Personal history of other infectious and parasitic diseases: Secondary | ICD-10-CM | POA: Insufficient documentation

## 2014-01-23 DIAGNOSIS — Z8673 Personal history of transient ischemic attack (TIA), and cerebral infarction without residual deficits: Secondary | ICD-10-CM | POA: Insufficient documentation

## 2014-01-23 DIAGNOSIS — F329 Major depressive disorder, single episode, unspecified: Secondary | ICD-10-CM | POA: Diagnosis not present

## 2014-01-23 DIAGNOSIS — R197 Diarrhea, unspecified: Secondary | ICD-10-CM | POA: Diagnosis not present

## 2014-01-23 DIAGNOSIS — G8929 Other chronic pain: Secondary | ICD-10-CM | POA: Insufficient documentation

## 2014-01-23 DIAGNOSIS — M5137 Other intervertebral disc degeneration, lumbosacral region: Secondary | ICD-10-CM | POA: Diagnosis not present

## 2014-01-23 DIAGNOSIS — Z79899 Other long term (current) drug therapy: Secondary | ICD-10-CM | POA: Diagnosis not present

## 2014-01-23 DIAGNOSIS — R112 Nausea with vomiting, unspecified: Secondary | ICD-10-CM | POA: Insufficient documentation

## 2014-01-23 DIAGNOSIS — F3289 Other specified depressive episodes: Secondary | ICD-10-CM | POA: Insufficient documentation

## 2014-01-23 DIAGNOSIS — M542 Cervicalgia: Secondary | ICD-10-CM | POA: Diagnosis not present

## 2014-01-23 DIAGNOSIS — Z791 Long term (current) use of non-steroidal anti-inflammatories (NSAID): Secondary | ICD-10-CM | POA: Insufficient documentation

## 2014-01-23 DIAGNOSIS — M51379 Other intervertebral disc degeneration, lumbosacral region without mention of lumbar back pain or lower extremity pain: Secondary | ICD-10-CM | POA: Insufficient documentation

## 2014-01-23 DIAGNOSIS — F17200 Nicotine dependence, unspecified, uncomplicated: Secondary | ICD-10-CM | POA: Diagnosis not present

## 2014-01-23 DIAGNOSIS — Z87448 Personal history of other diseases of urinary system: Secondary | ICD-10-CM | POA: Diagnosis not present

## 2014-01-23 DIAGNOSIS — R109 Unspecified abdominal pain: Secondary | ICD-10-CM | POA: Diagnosis not present

## 2014-01-23 DIAGNOSIS — Z7982 Long term (current) use of aspirin: Secondary | ICD-10-CM | POA: Insufficient documentation

## 2014-01-23 DIAGNOSIS — F1110 Opioid abuse, uncomplicated: Secondary | ICD-10-CM | POA: Diagnosis not present

## 2014-01-23 DIAGNOSIS — F1123 Opioid dependence with withdrawal: Secondary | ICD-10-CM

## 2014-01-23 DIAGNOSIS — R079 Chest pain, unspecified: Secondary | ICD-10-CM | POA: Diagnosis present

## 2014-01-23 DIAGNOSIS — F1193 Opioid use, unspecified with withdrawal: Secondary | ICD-10-CM

## 2014-01-23 HISTORY — DX: Other chronic pain: G89.29

## 2014-01-23 HISTORY — DX: Dorsalgia, unspecified: M54.9

## 2014-01-23 LAB — URINALYSIS, ROUTINE W REFLEX MICROSCOPIC
GLUCOSE, UA: NEGATIVE mg/dL
Leukocytes, UA: NEGATIVE
Nitrite: POSITIVE — AB
PROTEIN: 100 mg/dL — AB
Specific Gravity, Urine: 1.015 (ref 1.005–1.030)
UROBILINOGEN UA: 0.2 mg/dL (ref 0.0–1.0)
pH: 6.5 (ref 5.0–8.0)

## 2014-01-23 LAB — COMPREHENSIVE METABOLIC PANEL
ALBUMIN: 4.4 g/dL (ref 3.5–5.2)
ALT: 36 U/L (ref 0–53)
AST: 36 U/L (ref 0–37)
Alkaline Phosphatase: 90 U/L (ref 39–117)
BUN: 16 mg/dL (ref 6–23)
CALCIUM: 10.2 mg/dL (ref 8.4–10.5)
CO2: 27 mEq/L (ref 19–32)
CREATININE: 0.81 mg/dL (ref 0.50–1.35)
Chloride: 97 mEq/L (ref 96–112)
GFR calc Af Amer: >90 mL/min (ref >90)
Glucose, Bld: 146 mg/dL — ABNORMAL HIGH (ref 70–99)
Potassium: 3.1 mEq/L — ABNORMAL LOW (ref 3.7–5.3)
Sodium: 142 mEq/L (ref 137–147)
TOTAL PROTEIN: 9.4 g/dL — AB (ref 6.0–8.3)
Total Bilirubin: 1.3 mg/dL — ABNORMAL HIGH (ref 0.3–1.2)

## 2014-01-23 LAB — CBC WITH DIFFERENTIAL/PLATELET
BASOS ABS: 0 10*3/uL (ref 0.0–0.1)
BASOS PCT: 0 % (ref 0–1)
EOS ABS: 0 10*3/uL (ref 0.0–0.7)
EOS PCT: 0 % (ref 0–5)
HEMATOCRIT: 49.3 % (ref 39.0–52.0)
Hemoglobin: 17.5 g/dL — ABNORMAL HIGH (ref 13.0–17.0)
Lymphocytes Relative: 11 % — ABNORMAL LOW (ref 12–46)
Lymphs Abs: 1.3 10*3/uL (ref 0.7–4.0)
MCH: 32.2 pg (ref 26.0–34.0)
MCHC: 35.5 g/dL (ref 30.0–36.0)
MCV: 90.8 fL (ref 78.0–100.0)
MONO ABS: 0.9 10*3/uL (ref 0.1–1.0)
Monocytes Relative: 7 % (ref 3–12)
Neutro Abs: 10 10*3/uL — ABNORMAL HIGH (ref 1.7–7.7)
Neutrophils Relative %: 82 % — ABNORMAL HIGH (ref 43–77)
PLATELETS: 299 10*3/uL (ref 150–400)
RBC: 5.43 MIL/uL (ref 4.22–5.81)
RDW: 13.8 % (ref 11.5–15.5)
WBC: 12.2 10*3/uL — ABNORMAL HIGH (ref 4.0–10.5)

## 2014-01-23 LAB — TROPONIN I

## 2014-01-23 LAB — URINE MICROSCOPIC-ADD ON

## 2014-01-23 LAB — LIPASE, BLOOD: Lipase: 20 U/L (ref 11–59)

## 2014-01-23 MED ORDER — HYDROMORPHONE HCL PF 1 MG/ML IJ SOLN
1.0000 mg | Freq: Once | INTRAMUSCULAR | Status: AC
  Administered 2014-01-23: 1 mg via INTRAVENOUS
  Filled 2014-01-23: qty 1

## 2014-01-23 MED ORDER — MORPHINE SULFATE 4 MG/ML IJ SOLN
4.0000 mg | Freq: Once | INTRAMUSCULAR | Status: AC
  Administered 2014-01-23: 4 mg via INTRAVENOUS
  Filled 2014-01-23: qty 1

## 2014-01-23 MED ORDER — POTASSIUM CHLORIDE CRYS ER 20 MEQ PO TBCR
40.0000 meq | EXTENDED_RELEASE_TABLET | Freq: Once | ORAL | Status: AC
  Administered 2014-01-23: 40 meq via ORAL
  Filled 2014-01-23: qty 2

## 2014-01-23 MED ORDER — KETOROLAC TROMETHAMINE 30 MG/ML IJ SOLN
30.0000 mg | Freq: Once | INTRAMUSCULAR | Status: AC
  Administered 2014-01-23: 30 mg via INTRAVENOUS
  Filled 2014-01-23: qty 1

## 2014-01-23 MED ORDER — NAPROXEN 375 MG PO TABS: 375.0000 mg | ORAL_TABLET | Freq: Two times a day (BID) | ORAL | Status: DC

## 2014-01-23 MED ORDER — ONDANSETRON HCL 4 MG/2ML IJ SOLN
4.0000 mg | Freq: Once | INTRAMUSCULAR | Status: AC
  Administered 2014-01-23: 4 mg via INTRAVENOUS

## 2014-01-23 MED ORDER — CYCLOBENZAPRINE HCL 5 MG PO TABS: 5.0000 mg | ORAL_TABLET | Freq: Three times a day (TID) | ORAL | Status: DC | PRN

## 2014-01-23 MED ORDER — ONDANSETRON HCL 8 MG PO TABS: 8.0000 mg | ORAL_TABLET | Freq: Three times a day (TID) | ORAL | Status: DC | PRN

## 2014-01-23 MED ORDER — ONDANSETRON HCL 4 MG/2ML IJ SOLN
4.0000 mg | Freq: Once | INTRAMUSCULAR | Status: DC
  Filled 2014-01-23: qty 2

## 2014-01-23 MED ORDER — PROMETHAZINE HCL 25 MG/ML IJ SOLN
12.5000 mg | Freq: Four times a day (QID) | INTRAMUSCULAR | Status: DC | PRN
Start: 2014-01-23 — End: 2014-01-23
  Administered 2014-01-23: 12.5 mg via INTRAVENOUS
  Filled 2014-01-23: qty 1

## 2014-01-23 MED ORDER — SODIUM CHLORIDE 0.9 % IV SOLN
Freq: Once | INTRAVENOUS | Status: AC
  Administered 2014-01-23: 1000 mL via INTRAVENOUS

## 2014-01-23 MED ORDER — ASPIRIN 81 MG PO CHEW
324.0000 mg | CHEWABLE_TABLET | Freq: Once | ORAL | Status: AC
  Administered 2014-01-23: 324 mg via ORAL
  Filled 2014-01-23: qty 4

## 2014-01-23 NOTE — ED Notes (Signed)
Patient brought in via EMS from home. Airway patent. Alert and oriented. Patient c/o left side chest pain that radiates into back x2 days.  Patient also c/o lower back pain.  Patient reports SOB, nausea, vomiting, wekaness, and diarrhea. Patient also states that he has chronic back pain in which he takes morphine ER 60mg  with 15mg  morphine quick release for break through pain and valium 10mg . Per patient has been missing Pain medication since Monday and unable to keep valium "down" in 2 days due to vomiting. Patient states that he has tried unsuccessfully to get in touch with pain clinic-[facility] Nicholaus Bloom MD)

## 2014-01-23 NOTE — ED Provider Notes (Signed)
CSN: [id number]     Arrival date & time 01/23/14  3235 History   First MD Initiated Contact with Patient 01/23/14 [phone]     Chief Complaint  Patient presents with  . Chest Pain  . Back Pain     (Consider location/radiation/quality/duration/timing/severity/associated sxs/prior Treatment) HPI Comments: DELANDO SATTER is a 53 y.o. Male with a past medical history significant for hepatitis C without cirrhosis and chronic back pain being treated by Dr. Hardin Negus of pain management in [place] presenting with a two-day history of nearly constant left-sided chest pressure, nausea with nonbloody emesis, low were cramping abdominal pain with nonbloody diarrhea.  He denies dizziness, fevers, lightheadedness and has had no diaphoresis.  The symptoms are constant and he has found no alleviators or aggravators for his symptoms.  He believes his abdominal pain is secondary to the frequent vomiting and associated muscle soreness.  He states he has been unable to find his morphine pain medication since taking a road trip to drive his son to an out of town job.  His last dose of pain medication was taken Monday morning. He endorses typical back pain which starts in his lower back but radiates up to his neck with radiation into both legs without weakness or numbness.  He denies urinary or fecal incontinence.  He has been unable to contact Dr. Hardin Negus regarding this problem and is unable to get his pain medication refilled for 4 days.     The history is provided by the patient.    Past Medical History  Diagnosis Date  . Hepatitis C     blood transfusion 1988  . Depression   . Degenerative disc disease, lumbar   . Spinal stenosis   . CVA (cerebral infarction)     per patient  . Chronic back pain   . Renal disorder    Past Surgical History  Procedure Laterality Date  . Knee surgery Left     X5  . Rotator cuff repair Right   . Knee surgery Right   . Surgery on ears Bilateral   . Ankle surgery Right    . L4-l5 fusion      x2  . Kidney stone extraction  1989  . Maloney dilation N/A 02/14/2013    Procedure: Venia Minks DILATION;  Surgeon: Daneil Dolin, MD;  Location: [facility];  Service: Endoscopy;  Laterality: N/A;  . Esophagogastroduodenoscopy (egd) with propofol N/A 02/14/2013    Procedure: ESOPHAGOGASTRODUODENOSCOPY (EGD) WITH PROPOFOL;  Surgeon: Daneil Dolin, MD;  Location: [facility];  Service: Endoscopy;  Laterality: N/A;  . Colonoscopy with propofol N/A 02/14/2013    Procedure: COLONOSCOPY WITH PROPOFOL;  Surgeon: Daneil Dolin, MD;  Location: [facility];  Service: Endoscopy;  Laterality: N/A;  At cecum:09:16,  cecal withdrawal time:0930, total cecal time:14 minutes   Family History  Problem Relation Age of Onset  . Heart disease Mother   . Osteoarthritis Mother   . Colon cancer Neg Hx   . Liver disease Neg Hx   . Heart disease Father    History  Substance Use Topics  . Smoking status: Current Every Day Smoker -- 1.00 packs/day for 40 years    Types: Cigarettes  . Smokeless tobacco: Never Used  . Alcohol Use: No    Review of Systems  Constitutional: Negative for fever and chills.  HENT: Negative for congestion and sore throat.   Eyes: Negative.   Respiratory: Negative for chest tightness and shortness of breath.   Cardiovascular: Positive for chest  pain. Negative for leg swelling.  Gastrointestinal: Positive for nausea, vomiting, abdominal pain and diarrhea.  Genitourinary: Negative.   Musculoskeletal: Positive for back pain and neck pain. Negative for joint swelling.  Skin: Negative.  Negative for rash and wound.  Neurological: Negative for dizziness, weakness, light-headedness, numbness and headaches.  Psychiatric/Behavioral: Negative.       Allergies  Review of patient's allergies indicates no known allergies.  Home Medications   Current Outpatient Rx  Name  Route  Sig  Dispense  Refill  . aspirin 325 MG tablet   Oral   Take 325 mg by mouth daily.           .  diazepam (VALIUM) 10 MG tablet   Oral   Take 10 mg by mouth 3 (three) times daily.          Marland Kitchen escitalopram (LEXAPRO) 10 MG tablet   Oral   Take 10 mg by mouth daily.          Marland Kitchen morphine (MSIR) 15 MG tablet   Oral   Take 15 mg by mouth 3 (three) times daily.         . MORPHINE SULFATE ER PO   Oral   Take 60 mg by mouth 3 (three) times daily.         . sodium chloride (OCEAN) 0.65 % SOLN nasal spray   Each Nare   Place 2 sprays into both nostrils as needed for congestion.         . cyclobenzaprine (FLEXERIL) 5 MG tablet   Oral   Take 1 tablet (5 mg total) by mouth 3 (three) times daily as needed for muscle spasms.   30 tablet   0   . naproxen (NAPROSYN) 375 MG tablet   Oral   Take 1 tablet (375 mg total) by mouth 2 (two) times daily.   20 tablet   0   . ondansetron (ZOFRAN) 8 MG tablet   Oral   Take 1 tablet (8 mg total) by mouth every 8 (eight) hours as needed for nausea or vomiting.   20 tablet   0    BP 143/76  Pulse 57  Temp(Src) 98.8 F (37.1 C) (Oral)  Resp 13  Ht 5\' 11"  (1.803 m)  Wt 190 lb (86.183 kg)  BMI 26.51 kg/m2  SpO2 100% Physical Exam  Nursing note and vitals reviewed. Constitutional: He appears well-developed and well-nourished.  HENT:  Head: Normocephalic and atraumatic.  Eyes: Conjunctivae are normal.  Neck: Normal range of motion. Neck supple.  Cardiovascular: Normal rate, regular rhythm, normal heart sounds and intact distal pulses.   Pedal pulses normal. No peripheral edema.  Pulmonary/Chest: Effort normal and breath sounds normal. He has no wheezes.  Abdominal: Soft. Bowel sounds are normal. He exhibits no distension, no fluid wave, no abdominal bruit and no mass. There is no hepatosplenomegaly. There is no tenderness.  Musculoskeletal: Normal range of motion. He exhibits no edema.       Lumbar back: He exhibits tenderness. He exhibits no swelling, no edema and no spasm.  Neurological: He is alert. He has normal strength. He  displays no atrophy and no tremor. No sensory deficit.  No strength deficit noted in hip and knee flexor and extensor muscle groups.  Ankle flexion and extension intact.  Skin: Skin is warm and dry.  Psychiatric: He has a normal mood and affect.    ED Course  Procedures (including critical care time) Labs Review Labs Reviewed  URINALYSIS, ROUTINE  W REFLEX MICROSCOPIC - Abnormal; Notable for the following:    Color, Urine BROWN (*)    APPearance HAZY (*)    Hgb urine dipstick MODERATE (*)    Bilirubin Urine MODERATE (*)    Ketones, ur TRACE (*)    Protein, ur 100 (*)    Nitrite POSITIVE (*)    All other components within normal limits  CBC WITH DIFFERENTIAL - Abnormal; Notable for the following:    WBC 12.2 (*)    Hemoglobin 17.5 (*)    Neutrophils Relative % 82 (*)    Neutro Abs 10.0 (*)    Lymphocytes Relative 11 (*)    All other components within normal limits  COMPREHENSIVE METABOLIC PANEL - Abnormal; Notable for the following:    Potassium 3.1 (*)    Glucose, Bld 146 (*)    Total Protein 9.4 (*)    Total Bilirubin 1.3 (*)    All other components within normal limits  LIPASE, BLOOD  TROPONIN I  TROPONIN I  URINE MICROSCOPIC-ADD ON   Imaging Review Dg Chest Port 1 View  01/23/2014   CLINICAL DATA:  53 year old male with chest pain. Initial encounter.  EXAM: PORTABLE CHEST - 1 VIEW  COMPARISON:  06/26/2006.  FINDINGS: Portable AP upright view at 1014 hr. Stable, normal lung volumes. Normal cardiac size and mediastinal contours. Visualized tracheal air column is within normal limits. Stable mild increased interstitial markings. Otherwise Allowing for portable technique, the lungs are clear. No pneumothorax.  IMPRESSION: No acute cardiopulmonary abnormality.   Electronically Signed   By: Lars Pinks M.D.   On: 01/23/2014 10:25     EKG Interpretation   Date/Time:  Thursday January 23 2014 13:09:53 EDT Ventricular Rate:  46 PR Interval:  118 QRS Duration: 86 QT Interval:   510 QTC Calculation: 446 R Axis:   -2 Text Interpretation:  Sinus bradycardia Otherwise normal ECG When compared  with ECG of 23-Jan-2014 09:31, Nonspecific T wave abnormality, improved in  Inferior leads Nonspecific T wave abnormality no longer evident in Lateral  leads Confirmed by KNAPP  MD-I, IVA ([id number]) on 01/23/2014 1:17:44 PM        MDM   Final diagnoses:  Opiate withdrawal  Nausea and vomiting  Chest pain, atypical    Medications  promethazine (PHENERGAN) injection 12.5 mg (12.5 mg Intravenous Given 01/23/14 1119)  aspirin chewable tablet 324 mg (324 mg Oral Given 01/23/14 1026)  morphine 4 MG/ML injection 4 mg (4 mg Intravenous Given 01/23/14 1027)  0.9 %  sodium chloride infusion ( Intravenous Stopped 01/23/14 1058)  ondansetron (ZOFRAN) injection 4 mg (4 mg Intravenous Given 01/23/14 1045)  HYDROmorphone (DILAUDID) injection 1 mg (1 mg Intravenous Given 01/23/14 1118)  potassium chloride SA (K-DUR,KLOR-CON) CR tablet 40 mEq (40 mEq Oral Given 01/23/14 1411)  ketorolac (TORADOL) 30 MG/ML injection 30 mg (30 mg Intravenous Given 01/23/14 1411)     Pts labs reviewed and discussed with patient and with Dr Tomi Bamberger.  Suspect patients nausea/vomiting sx related to narcotic withdrawal.  His sx were improved after medicines and IV fluids listed above.  Labs stable.   He was encouraged to f/u with his pain management specialist.  Advised him that we cannot prescribe him narcotics given he has a chronic pain specialist.  He should f/u with Dr. Hardin Negus or his pcp for further management if sx persist or worsen.  Also was referred to cardiology for general stress testing given risk factors for possible coronary disease,  Although ekg,  troponins  x 2 today are stable.  Doubt cp today is of cardiac origin.  The patient appears reasonably screened and/or stabilized for discharge and I doubt any other medical condition or other [facility] requiring further screening, evaluation, or treatment in the ED at this time  prior to discharge.     Evalee Jefferson, PA-C 01/23/14 1737

## 2014-01-23 NOTE — ED Provider Notes (Signed)
Medical screening examination/treatment/procedure(s) were performed by non-physician practitioner and as supervising physician I was immediately available for consultation/collaboration.   EKG Interpretation   Date/Time:  Thursday January 23 2014 09:31:59 EDT Ventricular Rate:  54 PR Interval:  124 QRS Duration: 78 QT Interval:  444 QTC Calculation: 421 R Axis:   43 Text Interpretation:  Sinus bradycardia with sinus arrhythmia Nonspecific  ST and T wave abnormality When compared with ECG of 11-Feb-2013 13:37,  Vent. rate has decreased BY  27 BPM T wave inversion more evident in  Inferior leads Nonspecific T wave abnormality now evident in Anterolateral  leads Confirmed by Marvetta Vohs  MD-I, Othmar Ringer ([id number]) on 01/23/2014 9:51:39 AM     EKG Interpretation  Date/Time:  Thursday January 23 2014 13:09:53 EDT Ventricular Rate:  46 PR Interval:  118 QRS Duration: 86 QT Interval:  510 QTC Calculation: 446 R Axis:   -2 Text Interpretation:  Sinus bradycardia Otherwise normal ECG When compared with ECG of 23-Jan-2014 09:31, Nonspecific T wave abnormality, improved in Inferior leads Nonspecific T wave abnormality no longer evident in Lateral leads Confirmed by Clare  MD-I, Joury Allcorn ([id number]) on 01/23/2014 1:17:44 PM       Rolland Porter, MD, Alanson Aly, MD 01/23/14 1536

## 2014-01-23 NOTE — Discharge Instructions (Signed)
Narcotic Withdrawal If you take narcotic drugs for a long time, you may become dependent on them. Stopping these medicines suddenly can cause physical symptoms of withdrawal. Narcotics include opiate prescription pain medicines and heroin. Commonly prescribed narcotics include codeine, hydrocodone, oxycodone, methadone, and morphine. SYMPTOMS  Narcotics tend to slow down body and mental function. When you quit taking narcotics, your body and mind are stimulated. Some withdrawal symptoms include:  Irritability.  Anxiety.  Runny nose.  "Goose flesh."  Diarrhea.  Nausea.  Muscle spasms.  Sleeplessness.  Chills.  Sweats.  Drug cravings.  Confusion. Withdrawal symptoms are troubling. The severity depends on:  Your body's make up.  The amount of drugs you used.  The length of time you used them. You may be at greater risk of having twitching and shaking (seizure) during the first several days of withdrawal from sedative drugs, including narcotics. However, opiate withdrawal rarely causes a seizure. Withdrawal is uncomfortable, but it is not life-threatening for adults unless there is a medical complication, such as heart disease. HOME CARE INSTRUCTIONS   Drink fluids, get plenty of rest, and take hot baths.  Medicines may be prescribed to help control withdrawal symptoms.  Over-the-counter medicines may be helpful to control diarrhea or an upset stomach.  If your problems resulted from taking prescription pain medicines, make sure you have a follow-up visit with your caregiver within the next few days. Be open about this problem.  If you are dependent or addicted to street drugs, contact a local drug and alcohol treatment center or Narcotics Anonymous.  Have someone with you to monitor your symptoms.  Engage in healthy activities with friends who do not use drugs.  Stay away from the drug scene. SEEK IMMEDIATE MEDICAL CARE IF:   You have vomiting that cannot be  controlled, especially if you cannot keep liquids down.  You are seeing things or hearing voices that are not really there (hallucinating).  You have a seizure. Document Released: 11/10/2004 Document Revised: 12/26/2011 Document Reviewed: 03/05/2010 [facility] Patient Information 2014 [place], [place].  Nausea and Vomiting Nausea means you feel sick to your stomach. Throwing up (vomiting) is a reflex where stomach contents come out of your mouth. HOME CARE   Take medicine as told by your doctor.  Do not force yourself to eat. However, you do need to drink fluids.  If you feel like eating, eat a normal diet as told by your doctor.  Eat rice, wheat, potatoes, bread, lean meats, yogurt, fruits, and vegetables.  Avoid high-fat foods.  Drink enough fluids to keep your pee (urine) clear or pale yellow.  Ask your doctor how to replace body fluid losses (rehydrate). Signs of body fluid loss (dehydration) include:  Feeling very thirsty.  Dry lips and mouth.  Feeling dizzy.  Dark pee.  Peeing less than normal.  Feeling confused.  Fast breathing or heart rate. GET HELP RIGHT AWAY IF:   You have blood in your throw up.  You have black or bloody poop (stool).  You have a bad headache or stiff neck.  You feel confused.  You have bad belly (abdominal) pain.  You have chest pain or trouble breathing.  You do not pee at least once every 8 hours.  You have cold, clammy skin.  You keep throwing up after 24 to 48 hours.  You have a fever. MAKE SURE YOU:   Understand these instructions.  Will watch your condition.  Will get help right away if you are not doing well  or get worse. Document Released: 03/21/2008 Document Revised: 12/26/2011 Document Reviewed: 03/04/2011 [facility] Patient Information 2014 [place], [place].  Chest Pain (Nonspecific) Chest pain has many causes. Your pain could be caused by something serious, such as a heart attack or a blood clot in the lungs.  It could also be caused by something less serious, such as a chest bruise or a virus. Follow up with your doctor. More lab tests or other studies may be needed to find the cause of your pain. Most of the time, nonspecific chest pain will improve within 2 to 3 days of rest and mild pain medicine. HOME CARE  For chest bruises, you may put ice on the sore area for 15-20 minutes, 03-04 times a day. Do this only if it makes you feel better.  Put ice in a plastic bag.  Place a towel between the skin and the bag.  Rest for the next 2 to 3 days.  Go back to work if the pain improves.  See your doctor if the pain lasts longer than 1 to 2 weeks.  Only take medicine as told by your doctor.  Quit smoking if you smoke. GET HELP RIGHT AWAY IF:   There is more pain or pain that spreads to the arm, neck, jaw, back, or belly (abdomen).  You have shortness of breath.  You cough more than usual or cough up blood.  You have very bad back or belly pain, feel sick to your stomach (nauseous), or throw up (vomit).  You have very bad weakness.  You pass out (faint).  You have a fever. Any of these problems may be serious and may be an emergency. Do not wait to see if the problems will go away. Get medical help right away. Call your local emergency services 911 in [place] Do not drive yourself to the hospital. MAKE SURE YOU:   Understand these instructions.  Will watch this condition.  Will get help right away if you or your child is not doing well or gets worse. Document Released: 03/21/2008 Document Revised: 12/26/2011 Document Reviewed: 03/21/2008 [facility] Patient Information 2014 [place], [place].

## 2014-01-23 NOTE — ED Notes (Signed)
Patient vomited shortly after asa administered. EDPA made aware, order given.

## 2014-01-25 NOTE — ED Provider Notes (Signed)
Medical screening examination/treatment/procedure(s) were performed by non-physician practitioner and as supervising physician I was immediately available for consultation/collaboration.   EKG Interpretation   Date/Time:  Thursday January 23 2014 13:09:53 EDT Ventricular Rate:  46 PR Interval:  118 QRS Duration: 86 QT Interval:  510 QTC Calculation: 446 R Axis:   -2 Text Interpretation:  Sinus bradycardia Otherwise normal ECG When compared  with ECG of 23-Jan-2014 09:31, Nonspecific T wave abnormality, improved in  Inferior leads Nonspecific T wave abnormality no longer evident in Lateral  leads Confirmed by [place]  MD-I, Audrea Bolte ([id number]) on 01/23/2014 1:17:44 PM      Rolland Porter, MD, Alanson Aly, MD 01/25/14 [phone]

## 2015-03-31 ENCOUNTER — Ambulatory Visit: Payer: PRIVATE HEALTH INSURANCE | Admitting: Family

## 2015-06-10 ENCOUNTER — Encounter: Payer: Self-pay | Admitting: Family Medicine

## 2015-06-10 ENCOUNTER — Ambulatory Visit (INDEPENDENT_AMBULATORY_CARE_PROVIDER_SITE_OTHER): Payer: BLUE CROSS/BLUE SHIELD | Admitting: Family Medicine

## 2015-06-10 VITALS — BP 99/71 | HR 71 | Temp 98.2°F | Ht 71.0 in | Wt 200.6 lb

## 2015-06-10 DIAGNOSIS — B3749 Other urogenital candidiasis: Secondary | ICD-10-CM | POA: Diagnosis not present

## 2015-06-10 DIAGNOSIS — N432 Other hydrocele: Secondary | ICD-10-CM | POA: Diagnosis not present

## 2015-06-10 DIAGNOSIS — A630 Anogenital (venereal) warts: Secondary | ICD-10-CM | POA: Insufficient documentation

## 2015-06-10 DIAGNOSIS — N433 Hydrocele, unspecified: Secondary | ICD-10-CM | POA: Insufficient documentation

## 2015-06-10 MED ORDER — CLOTRIMAZOLE 1 % EX CREA: 1.0000 "application " | TOPICAL_CREAM | Freq: Two times a day (BID) | CUTANEOUS | Status: DC

## 2015-06-10 NOTE — Assessment & Plan Note (Signed)
Patient has a right-sided hydrocele that he has had for many years but feels like it is increasing size and would like to go back and see the urologist that he saw before. We'll do referral

## 2015-06-10 NOTE — Assessment & Plan Note (Signed)
Patient has a small less than a quarter centimeter genital wart on the end of his penis near the urethral opening slightly on the right side. We will send to urology for treatment for this as well because of the sensitivity of the area.

## 2015-06-10 NOTE — Progress Notes (Signed)
BP 99/71 mmHg  Pulse 71  Temp(Src) 98.2 F (36.8 C) (Oral)  Ht 5\' 11"  (1.803 m)  Wt 200 lb 9.6 oz (90.992 kg)  BMI 27.99 kg/m2   Subjective:    Patient ID: Shawn Russell, male    DOB: 03-09-1961, 54 y.o.   MRN: [id number]  HPI: Shawn Russell is a 54 y.o. male presenting on 06/10/2015 for Penile lesions and Urology referral   HPI Gonadal swelling Patient has been diagnosed with a hydrocele previously on the right gonadal sac. He feels like it has enlarged and would like to go back into the urologist. He last saw them 3 years ago. There is no redness and the swelling and does feel like a fluid sac per patient. There is also no pain with the swelling.  Penile growth Patient has an new growth on the end of his penis right near the urethral opening. This has been there for a month or 2 and is starting to decrease in size. It is not painful and there is no redness or swelling with.  Penile rash Patient has redness and rash on the head of his penis. This is been there for about a week, and he has had this previously one time and it went away completely with treatment at that time. He cannot remember what the treatment was but it was some kind of cream. This will Hgb but not painful or sore. He is uncircumcised and does not always clean behind his foreskin admittedly per patient.  Relevant past medical, surgical, family and social history reviewed and updated as indicated. Interim medical history since our last visit reviewed. Allergies and medications reviewed and updated.  Review of Systems  Constitutional: Negative for fever.  HENT: Negative for ear discharge and ear pain.   Eyes: Negative for discharge and visual disturbance.  Respiratory: Negative for shortness of breath and wheezing.   Cardiovascular: Negative for chest pain and leg swelling.  Gastrointestinal: Negative for abdominal pain, diarrhea and constipation.  Genitourinary: Negative for difficulty urinating.    Musculoskeletal: Negative for back pain and gait problem.  Skin: Positive for color change and rash (and lesion).  Neurological: Negative for syncope, light-headedness and headaches.  All other systems reviewed and are negative.   Per HPI unless specifically indicated above     Medication List       This list is accurate as of: 06/10/15  1:24 PM.  Always use your most recent med list.               aspirin 325 MG tablet  Take 325 mg by mouth daily.     clotrimazole 1 % cream  Commonly known as:  LOTRIMIN  Apply 1 application topically 2 (two) times daily.     diazepam 1 MG/ML solution  Commonly known as:  VALIUM  Take by mouth 3 (three) times daily.     escitalopram 20 MG tablet  Commonly known as:  LEXAPRO  Take 20 mg by mouth daily.     fentaNYL 50 MCG/HR  Commonly known as:  DURAGESIC - dosed mcg/hr  Place 50 mcg onto the skin every other day.     HYDROmorphone 4 MG tablet  Commonly known as:  DILAUDID  Take by mouth every 6 (six) hours as needed for severe pain.           Objective:    BP 99/71 mmHg  Pulse 71  Temp(Src) 98.2 F (36.8 C) (Oral)  Ht 5\' 11"  (  1.803 m)  Wt 200 lb 9.6 oz (90.992 kg)  BMI 27.99 kg/m2  Wt Readings from Last 3 Encounters:  06/10/15 200 lb 9.6 oz (90.992 kg)  01/23/14 190 lb (86.183 kg)  02/11/13 215 lb (97.523 kg)    Physical Exam  Constitutional: He is oriented to person, place, and time. He appears well-developed and well-nourished. No distress.  Eyes: Conjunctivae and EOM are normal. Right eye exhibits no discharge. No scleral icterus.  Cardiovascular: Normal rate, regular rhythm, normal heart sounds and intact distal pulses.   No murmur heard. Pulmonary/Chest: Effort normal and breath sounds normal. No respiratory distress. He has no wheezes.  Abdominal: He exhibits no distension. Hernia confirmed negative in the right inguinal area and confirmed negative in the left inguinal area.  Genitourinary: Right testis shows  swelling. Right testis shows no mass and no tenderness. Right testis is descended. Cremasteric reflex is not absent on the right side. Left testis shows no mass, no swelling and no tenderness. Left testis is descended. Cremasteric reflex is not absent on the left side. No penile tenderness.  Musculoskeletal: Normal range of motion. He exhibits no edema.  Lymphadenopathy:       Right: No inguinal adenopathy present.       Left: No inguinal adenopathy present.  Neurological: He is alert and oriented to person, place, and time. Coordination normal.  Skin: Skin is warm and dry. Lesion (verrucous appearing lesion less than a quarter centimeter in size on the head of his penis adjacent to the urethral opening.) and rash noted. Rash is maculopapular (small fine papules along with macules and scattered lesions on the glans of his penis.). He is not diaphoretic.  Psychiatric: He has a normal mood and affect. His behavior is normal.  Vitals reviewed.   Results for orders placed or performed during the hospital encounter of 01/23/14  Urinalysis, Routine w reflex microscopic  Result Value Ref Range   Color, Urine BROWN (A) YELLOW   APPearance HAZY (A) CLEAR   Specific Gravity, Urine 1.015 1.005 - 1.030   pH 6.5 5.0 - 8.0   Glucose, UA NEGATIVE NEGATIVE mg/dL   Hgb urine dipstick MODERATE (A) NEGATIVE   Bilirubin Urine MODERATE (A) NEGATIVE   Ketones, ur TRACE (A) NEGATIVE mg/dL   Protein, ur 100 (A) NEGATIVE mg/dL   Urobilinogen, UA 0.2 0.0 - 1.0 mg/dL   Nitrite POSITIVE (A) NEGATIVE   Leukocytes, UA NEGATIVE NEGATIVE  CBC with Differential  Result Value Ref Range   WBC 12.2 (H) 4.0 - 10.5 K/uL   RBC 5.43 4.22 - 5.81 MIL/uL   Hemoglobin 17.5 (H) 13.0 - 17.0 g/dL   HCT 49.3 39.0 - 52.0 %   MCV 90.8 78.0 - 100.0 fL   MCH 32.2 26.0 - 34.0 pg   MCHC 35.5 30.0 - 36.0 g/dL   RDW 13.8 11.5 - 15.5 %   Platelets 299 150 - 400 K/uL   Neutrophils Relative % 82 (H) 43 - 77 %   Neutro Abs 10.0 (H) 1.7 -  7.7 K/uL   Lymphocytes Relative 11 (L) 12 - 46 %   Lymphs Abs 1.3 0.7 - 4.0 K/uL   Monocytes Relative 7 3 - 12 %   Monocytes Absolute 0.9 0.1 - 1.0 K/uL   Eosinophils Relative 0 0 - 5 %   Eosinophils Absolute 0.0 0.0 - 0.7 K/uL   Basophils Relative 0 0 - 1 %   Basophils Absolute 0.0 0.0 - 0.1 K/uL  Comprehensive metabolic panel  Result Value Ref Range   Sodium 142 137 - 147 mEq/L   Potassium 3.1 (L) 3.7 - 5.3 mEq/L   Chloride 97 96 - 112 mEq/L   CO2 27 19 - 32 mEq/L   Glucose, Bld 146 (H) 70 - 99 mg/dL   BUN 16 6 - 23 mg/dL   Creatinine, Ser 0.81 0.50 - 1.35 mg/dL   Calcium 10.2 8.4 - 10.5 mg/dL   Total Protein 9.4 (H) 6.0 - 8.3 g/dL   Albumin 4.4 3.5 - 5.2 g/dL   AST 36 0 - 37 U/L   ALT 36 0 - 53 U/L   Alkaline Phosphatase 90 39 - 117 U/L   Total Bilirubin 1.3 (H) 0.3 - 1.2 mg/dL   GFR calc non Af Amer >90 >90 mL/min   GFR calc Af Amer >90 >90 mL/min  Lipase, blood  Result Value Ref Range   Lipase 20 11 - 59 U/L  Troponin I  Result Value Ref Range   Troponin I <0.30 <0.30 ng/mL  Troponin I  Result Value Ref Range   Troponin I <0.30 <0.30 ng/mL  Urine microscopic-add on  Result Value Ref Range   RBC / HPF 3-6 <3 RBC/hpf   Urine-Other MUCOUS PRESENT       Assessment & Plan:   Problem List Items Addressed This Visit      Musculoskeletal and Integument   Yeast dermatitis of penis    Patient has eased dermatitis on the head of the glans of his penis likely because of lack of cleaning behind foreskin. We'll send Lotrimin cream. Return in one to 1-1/2 weeks if not improved.      Relevant Medications   clotrimazole (LOTRIMIN) 1 % cream   Genital warts    Patient has a small less than a quarter centimeter genital wart on the end of his penis near the urethral opening slightly on the right side. We will send to urology for treatment for this as well because of the sensitivity of the area.      Relevant Medications   clotrimazole (LOTRIMIN) 1 % cream   Other Relevant  Orders   Ambulatory referral to Urology     Genitourinary   Hydrocele - Primary    Patient has a right-sided hydrocele that he has had for many years but feels like it is increasing size and would like to go back and see the urologist that he saw before. We'll do referral      Relevant Orders   Ambulatory referral to Urology       Follow up plan: Return if symptoms worsen or fail to improve.  [name], MD [facility] 06/10/2015, 1:24 PM     .Elizabeth Palau

## 2015-06-10 NOTE — Assessment & Plan Note (Signed)
Patient has eased dermatitis on the head of the glans of his penis likely because of lack of cleaning behind foreskin. We'll send Lotrimin cream. Return in one to 1-1/2 weeks if not improved.

## 2015-07-02 ENCOUNTER — Encounter: Payer: Self-pay | Admitting: Physician Assistant

## 2015-07-02 ENCOUNTER — Ambulatory Visit (INDEPENDENT_AMBULATORY_CARE_PROVIDER_SITE_OTHER): Payer: Medicare Other | Admitting: Physician Assistant

## 2015-07-02 VITALS — BP 122/78 | HR 75 | Temp 97.9°F | Ht 71.0 in | Wt 196.0 lb

## 2015-07-02 DIAGNOSIS — L309 Dermatitis, unspecified: Secondary | ICD-10-CM

## 2015-07-13 NOTE — Progress Notes (Signed)
54 y/o male presents with c/o possible spider bite that he noticed 3 days ago on his trunk. He did not see a spider, however, the area has remained red and is itching. He has not tried any medications to relieve itching. Denies pain, fever, nausea/ vomiting, red streaking or feeling of heat over the area.   PE reveals erythematous macule on trunk with mild inflammation, approximately 2cm in diameter. No indication of infection.   Advised patient to apply otc hydrocortisone cream for relief of itch. May also take Benadryl, Claritin, or zyrtec for itch relief.   F/U if pain, streaking, n/v occurs. k  Tiffany A. Benjamin Stain PA-C

## 2015-09-11 ENCOUNTER — Encounter: Payer: Medicare Other | Admitting: Family Medicine

## 2015-09-14 ENCOUNTER — Encounter: Payer: Self-pay | Admitting: Nurse Practitioner

## 2015-09-18 ENCOUNTER — Telehealth: Payer: Self-pay | Admitting: Nurse Practitioner

## 2015-10-23 DIAGNOSIS — Z79891 Long term (current) use of opiate analgesic: Secondary | ICD-10-CM | POA: Diagnosis not present

## 2015-10-23 DIAGNOSIS — M961 Postlaminectomy syndrome, not elsewhere classified: Secondary | ICD-10-CM | POA: Diagnosis not present

## 2015-10-23 DIAGNOSIS — M47812 Spondylosis without myelopathy or radiculopathy, cervical region: Secondary | ICD-10-CM | POA: Diagnosis not present

## 2015-10-23 DIAGNOSIS — G894 Chronic pain syndrome: Secondary | ICD-10-CM | POA: Diagnosis not present

## 2015-11-20 DIAGNOSIS — G894 Chronic pain syndrome: Secondary | ICD-10-CM | POA: Diagnosis not present

## 2015-11-20 DIAGNOSIS — Z79891 Long term (current) use of opiate analgesic: Secondary | ICD-10-CM | POA: Diagnosis not present

## 2015-11-20 DIAGNOSIS — M961 Postlaminectomy syndrome, not elsewhere classified: Secondary | ICD-10-CM | POA: Diagnosis not present

## 2015-11-20 DIAGNOSIS — M47812 Spondylosis without myelopathy or radiculopathy, cervical region: Secondary | ICD-10-CM | POA: Diagnosis not present

## 2015-12-18 DIAGNOSIS — M47812 Spondylosis without myelopathy or radiculopathy, cervical region: Secondary | ICD-10-CM | POA: Diagnosis not present

## 2015-12-18 DIAGNOSIS — M961 Postlaminectomy syndrome, not elsewhere classified: Secondary | ICD-10-CM | POA: Diagnosis not present

## 2015-12-18 DIAGNOSIS — Z79891 Long term (current) use of opiate analgesic: Secondary | ICD-10-CM | POA: Diagnosis not present

## 2015-12-18 DIAGNOSIS — G894 Chronic pain syndrome: Secondary | ICD-10-CM | POA: Diagnosis not present

## 2016-01-02 DIAGNOSIS — M961 Postlaminectomy syndrome, not elsewhere classified: Secondary | ICD-10-CM | POA: Diagnosis not present

## 2016-01-02 DIAGNOSIS — M47812 Spondylosis without myelopathy or radiculopathy, cervical region: Secondary | ICD-10-CM | POA: Diagnosis not present

## 2016-01-02 DIAGNOSIS — G894 Chronic pain syndrome: Secondary | ICD-10-CM | POA: Diagnosis not present

## 2016-01-02 DIAGNOSIS — Z79891 Long term (current) use of opiate analgesic: Secondary | ICD-10-CM | POA: Diagnosis not present

## 2016-01-15 DIAGNOSIS — Z79891 Long term (current) use of opiate analgesic: Secondary | ICD-10-CM | POA: Diagnosis not present

## 2016-01-15 DIAGNOSIS — M961 Postlaminectomy syndrome, not elsewhere classified: Secondary | ICD-10-CM | POA: Diagnosis not present

## 2016-01-15 DIAGNOSIS — M47812 Spondylosis without myelopathy or radiculopathy, cervical region: Secondary | ICD-10-CM | POA: Diagnosis not present

## 2016-01-15 DIAGNOSIS — G894 Chronic pain syndrome: Secondary | ICD-10-CM | POA: Diagnosis not present

## 2016-02-12 DIAGNOSIS — M47812 Spondylosis without myelopathy or radiculopathy, cervical region: Secondary | ICD-10-CM | POA: Diagnosis not present

## 2016-02-12 DIAGNOSIS — M961 Postlaminectomy syndrome, not elsewhere classified: Secondary | ICD-10-CM | POA: Diagnosis not present

## 2016-02-12 DIAGNOSIS — G894 Chronic pain syndrome: Secondary | ICD-10-CM | POA: Diagnosis not present

## 2016-02-12 DIAGNOSIS — Z79891 Long term (current) use of opiate analgesic: Secondary | ICD-10-CM | POA: Diagnosis not present

## 2016-02-26 DIAGNOSIS — M47812 Spondylosis without myelopathy or radiculopathy, cervical region: Secondary | ICD-10-CM | POA: Diagnosis not present

## 2016-02-26 DIAGNOSIS — M961 Postlaminectomy syndrome, not elsewhere classified: Secondary | ICD-10-CM | POA: Diagnosis not present

## 2016-02-26 DIAGNOSIS — Z79891 Long term (current) use of opiate analgesic: Secondary | ICD-10-CM | POA: Diagnosis not present

## 2016-02-26 DIAGNOSIS — G894 Chronic pain syndrome: Secondary | ICD-10-CM | POA: Diagnosis not present

## 2016-03-11 DIAGNOSIS — G894 Chronic pain syndrome: Secondary | ICD-10-CM | POA: Diagnosis not present

## 2016-03-11 DIAGNOSIS — M47812 Spondylosis without myelopathy or radiculopathy, cervical region: Secondary | ICD-10-CM | POA: Diagnosis not present

## 2016-03-11 DIAGNOSIS — Z79891 Long term (current) use of opiate analgesic: Secondary | ICD-10-CM | POA: Diagnosis not present

## 2016-03-11 DIAGNOSIS — M961 Postlaminectomy syndrome, not elsewhere classified: Secondary | ICD-10-CM | POA: Diagnosis not present

## 2016-04-08 DIAGNOSIS — Z79891 Long term (current) use of opiate analgesic: Secondary | ICD-10-CM | POA: Diagnosis not present

## 2016-04-08 DIAGNOSIS — M47812 Spondylosis without myelopathy or radiculopathy, cervical region: Secondary | ICD-10-CM | POA: Diagnosis not present

## 2016-04-08 DIAGNOSIS — M961 Postlaminectomy syndrome, not elsewhere classified: Secondary | ICD-10-CM | POA: Diagnosis not present

## 2016-04-08 DIAGNOSIS — G894 Chronic pain syndrome: Secondary | ICD-10-CM | POA: Diagnosis not present

## 2016-05-06 DIAGNOSIS — G894 Chronic pain syndrome: Secondary | ICD-10-CM | POA: Diagnosis not present

## 2016-05-06 DIAGNOSIS — M961 Postlaminectomy syndrome, not elsewhere classified: Secondary | ICD-10-CM | POA: Diagnosis not present

## 2016-05-06 DIAGNOSIS — Z79891 Long term (current) use of opiate analgesic: Secondary | ICD-10-CM | POA: Diagnosis not present

## 2016-05-06 DIAGNOSIS — M47812 Spondylosis without myelopathy or radiculopathy, cervical region: Secondary | ICD-10-CM | POA: Diagnosis not present

## 2016-05-19 ENCOUNTER — Encounter: Payer: Medicare Other | Admitting: Family Medicine

## 2016-06-03 DIAGNOSIS — M47812 Spondylosis without myelopathy or radiculopathy, cervical region: Secondary | ICD-10-CM | POA: Diagnosis not present

## 2016-06-03 DIAGNOSIS — Z79891 Long term (current) use of opiate analgesic: Secondary | ICD-10-CM | POA: Diagnosis not present

## 2016-06-03 DIAGNOSIS — M961 Postlaminectomy syndrome, not elsewhere classified: Secondary | ICD-10-CM | POA: Diagnosis not present

## 2016-06-03 DIAGNOSIS — G894 Chronic pain syndrome: Secondary | ICD-10-CM | POA: Diagnosis not present

## 2016-07-01 DIAGNOSIS — Z79891 Long term (current) use of opiate analgesic: Secondary | ICD-10-CM | POA: Diagnosis not present

## 2016-07-01 DIAGNOSIS — M47812 Spondylosis without myelopathy or radiculopathy, cervical region: Secondary | ICD-10-CM | POA: Diagnosis not present

## 2016-07-01 DIAGNOSIS — M961 Postlaminectomy syndrome, not elsewhere classified: Secondary | ICD-10-CM | POA: Diagnosis not present

## 2016-07-01 DIAGNOSIS — G894 Chronic pain syndrome: Secondary | ICD-10-CM | POA: Diagnosis not present

## 2016-07-29 DIAGNOSIS — G4700 Insomnia, unspecified: Secondary | ICD-10-CM | POA: Diagnosis not present

## 2016-07-29 DIAGNOSIS — M62838 Other muscle spasm: Secondary | ICD-10-CM | POA: Diagnosis not present

## 2016-07-29 DIAGNOSIS — Z79891 Long term (current) use of opiate analgesic: Secondary | ICD-10-CM | POA: Diagnosis not present

## 2016-07-29 DIAGNOSIS — F329 Major depressive disorder, single episode, unspecified: Secondary | ICD-10-CM | POA: Diagnosis not present

## 2016-07-29 DIAGNOSIS — G894 Chronic pain syndrome: Secondary | ICD-10-CM | POA: Diagnosis not present

## 2016-07-29 DIAGNOSIS — M961 Postlaminectomy syndrome, not elsewhere classified: Secondary | ICD-10-CM | POA: Diagnosis not present

## 2016-07-29 DIAGNOSIS — M47812 Spondylosis without myelopathy or radiculopathy, cervical region: Secondary | ICD-10-CM | POA: Diagnosis not present

## 2016-08-26 DIAGNOSIS — M47812 Spondylosis without myelopathy or radiculopathy, cervical region: Secondary | ICD-10-CM | POA: Diagnosis not present

## 2016-08-26 DIAGNOSIS — G894 Chronic pain syndrome: Secondary | ICD-10-CM | POA: Diagnosis not present

## 2016-08-26 DIAGNOSIS — Z79891 Long term (current) use of opiate analgesic: Secondary | ICD-10-CM | POA: Diagnosis not present

## 2016-08-26 DIAGNOSIS — M961 Postlaminectomy syndrome, not elsewhere classified: Secondary | ICD-10-CM | POA: Diagnosis not present

## 2016-09-07 ENCOUNTER — Telehealth: Payer: Self-pay | Admitting: Family Medicine

## 2016-09-07 NOTE — Telephone Encounter (Signed)
Scheduled

## 2016-09-15 ENCOUNTER — Encounter: Payer: Medicare Other | Admitting: Family Medicine

## 2016-09-23 DIAGNOSIS — Z79891 Long term (current) use of opiate analgesic: Secondary | ICD-10-CM | POA: Diagnosis not present

## 2016-09-23 DIAGNOSIS — M961 Postlaminectomy syndrome, not elsewhere classified: Secondary | ICD-10-CM | POA: Diagnosis not present

## 2016-09-23 DIAGNOSIS — M47812 Spondylosis without myelopathy or radiculopathy, cervical region: Secondary | ICD-10-CM | POA: Diagnosis not present

## 2016-09-23 DIAGNOSIS — G894 Chronic pain syndrome: Secondary | ICD-10-CM | POA: Diagnosis not present

## 2016-10-24 DIAGNOSIS — M961 Postlaminectomy syndrome, not elsewhere classified: Secondary | ICD-10-CM | POA: Diagnosis not present

## 2016-10-24 DIAGNOSIS — M47812 Spondylosis without myelopathy or radiculopathy, cervical region: Secondary | ICD-10-CM | POA: Diagnosis not present

## 2016-10-24 DIAGNOSIS — Z79891 Long term (current) use of opiate analgesic: Secondary | ICD-10-CM | POA: Diagnosis not present

## 2016-10-24 DIAGNOSIS — G894 Chronic pain syndrome: Secondary | ICD-10-CM | POA: Diagnosis not present

## 2016-11-07 DIAGNOSIS — Z87442 Personal history of urinary calculi: Secondary | ICD-10-CM | POA: Diagnosis not present

## 2016-11-07 DIAGNOSIS — Z125 Encounter for screening for malignant neoplasm of prostate: Secondary | ICD-10-CM | POA: Diagnosis not present

## 2016-11-07 DIAGNOSIS — N430 Encysted hydrocele: Secondary | ICD-10-CM | POA: Diagnosis not present

## 2016-11-07 DIAGNOSIS — R3121 Asymptomatic microscopic hematuria: Secondary | ICD-10-CM | POA: Diagnosis not present

## 2016-11-21 DIAGNOSIS — M961 Postlaminectomy syndrome, not elsewhere classified: Secondary | ICD-10-CM | POA: Diagnosis not present

## 2016-11-21 DIAGNOSIS — Z79891 Long term (current) use of opiate analgesic: Secondary | ICD-10-CM | POA: Diagnosis not present

## 2016-11-21 DIAGNOSIS — G894 Chronic pain syndrome: Secondary | ICD-10-CM | POA: Diagnosis not present

## 2016-11-21 DIAGNOSIS — M47812 Spondylosis without myelopathy or radiculopathy, cervical region: Secondary | ICD-10-CM | POA: Diagnosis not present

## 2016-11-28 DIAGNOSIS — R3129 Other microscopic hematuria: Secondary | ICD-10-CM | POA: Diagnosis not present

## 2016-11-28 DIAGNOSIS — Z87442 Personal history of urinary calculi: Secondary | ICD-10-CM | POA: Diagnosis not present

## 2016-11-28 DIAGNOSIS — R3121 Asymptomatic microscopic hematuria: Secondary | ICD-10-CM | POA: Diagnosis not present

## 2016-12-20 DIAGNOSIS — F329 Major depressive disorder, single episode, unspecified: Secondary | ICD-10-CM | POA: Diagnosis not present

## 2016-12-20 DIAGNOSIS — F411 Generalized anxiety disorder: Secondary | ICD-10-CM | POA: Diagnosis not present

## 2016-12-20 DIAGNOSIS — M47812 Spondylosis without myelopathy or radiculopathy, cervical region: Secondary | ICD-10-CM | POA: Diagnosis not present

## 2016-12-20 DIAGNOSIS — M961 Postlaminectomy syndrome, not elsewhere classified: Secondary | ICD-10-CM | POA: Diagnosis not present

## 2016-12-20 DIAGNOSIS — M25561 Pain in right knee: Secondary | ICD-10-CM | POA: Diagnosis not present

## 2016-12-20 DIAGNOSIS — K5900 Constipation, unspecified: Secondary | ICD-10-CM | POA: Diagnosis not present

## 2016-12-20 DIAGNOSIS — Z79891 Long term (current) use of opiate analgesic: Secondary | ICD-10-CM | POA: Diagnosis not present

## 2016-12-20 DIAGNOSIS — G894 Chronic pain syndrome: Secondary | ICD-10-CM | POA: Diagnosis not present

## 2016-12-21 DIAGNOSIS — N281 Cyst of kidney, acquired: Secondary | ICD-10-CM | POA: Diagnosis not present

## 2016-12-21 DIAGNOSIS — E291 Testicular hypofunction: Secondary | ICD-10-CM | POA: Diagnosis not present

## 2016-12-21 DIAGNOSIS — R3121 Asymptomatic microscopic hematuria: Secondary | ICD-10-CM | POA: Diagnosis not present

## 2016-12-21 DIAGNOSIS — N5201 Erectile dysfunction due to arterial insufficiency: Secondary | ICD-10-CM | POA: Diagnosis not present

## 2017-01-17 DIAGNOSIS — M961 Postlaminectomy syndrome, not elsewhere classified: Secondary | ICD-10-CM | POA: Diagnosis not present

## 2017-01-17 DIAGNOSIS — Z79891 Long term (current) use of opiate analgesic: Secondary | ICD-10-CM | POA: Diagnosis not present

## 2017-01-17 DIAGNOSIS — G894 Chronic pain syndrome: Secondary | ICD-10-CM | POA: Diagnosis not present

## 2017-01-17 DIAGNOSIS — M47812 Spondylosis without myelopathy or radiculopathy, cervical region: Secondary | ICD-10-CM | POA: Diagnosis not present

## 2017-01-18 DIAGNOSIS — E291 Testicular hypofunction: Secondary | ICD-10-CM | POA: Diagnosis not present

## 2017-01-18 DIAGNOSIS — N5201 Erectile dysfunction due to arterial insufficiency: Secondary | ICD-10-CM | POA: Diagnosis not present

## 2017-02-13 DIAGNOSIS — Z79891 Long term (current) use of opiate analgesic: Secondary | ICD-10-CM | POA: Diagnosis not present

## 2017-02-13 DIAGNOSIS — M47812 Spondylosis without myelopathy or radiculopathy, cervical region: Secondary | ICD-10-CM | POA: Diagnosis not present

## 2017-02-13 DIAGNOSIS — M961 Postlaminectomy syndrome, not elsewhere classified: Secondary | ICD-10-CM | POA: Diagnosis not present

## 2017-02-13 DIAGNOSIS — G894 Chronic pain syndrome: Secondary | ICD-10-CM | POA: Diagnosis not present

## 2017-03-14 ENCOUNTER — Encounter: Payer: BLUE CROSS/BLUE SHIELD | Admitting: Family Medicine

## 2017-03-15 ENCOUNTER — Telehealth: Payer: Self-pay | Admitting: Family Medicine

## 2017-03-15 ENCOUNTER — Encounter: Payer: Self-pay | Admitting: Family Medicine

## 2017-03-15 NOTE — Telephone Encounter (Signed)
Unable to reach patient, tried to call several times and phone appears to be out of order

## 2017-04-07 DIAGNOSIS — G894 Chronic pain syndrome: Secondary | ICD-10-CM | POA: Diagnosis not present

## 2017-04-07 DIAGNOSIS — M961 Postlaminectomy syndrome, not elsewhere classified: Secondary | ICD-10-CM | POA: Diagnosis not present

## 2017-04-07 DIAGNOSIS — Z79891 Long term (current) use of opiate analgesic: Secondary | ICD-10-CM | POA: Diagnosis not present

## 2017-04-07 DIAGNOSIS — M47812 Spondylosis without myelopathy or radiculopathy, cervical region: Secondary | ICD-10-CM | POA: Diagnosis not present

## 2017-04-12 ENCOUNTER — Encounter: Payer: Self-pay | Admitting: Family Medicine

## 2017-04-12 ENCOUNTER — Ambulatory Visit (INDEPENDENT_AMBULATORY_CARE_PROVIDER_SITE_OTHER): Payer: PPO | Admitting: Family Medicine

## 2017-04-12 VITALS — BP 131/73 | HR 79 | Temp 98.5°F | Ht 71.0 in | Wt 199.0 lb

## 2017-04-12 DIAGNOSIS — Z114 Encounter for screening for human immunodeficiency virus [HIV]: Secondary | ICD-10-CM

## 2017-04-12 DIAGNOSIS — Z131 Encounter for screening for diabetes mellitus: Secondary | ICD-10-CM | POA: Diagnosis not present

## 2017-04-12 DIAGNOSIS — Z0000 Encounter for general adult medical examination without abnormal findings: Secondary | ICD-10-CM

## 2017-04-12 DIAGNOSIS — G4730 Sleep apnea, unspecified: Secondary | ICD-10-CM

## 2017-04-12 DIAGNOSIS — E663 Overweight: Secondary | ICD-10-CM

## 2017-04-12 NOTE — Progress Notes (Signed)
BP 131/73   Pulse 79   Temp 98.5 F (36.9 C) (Oral)   Ht '5\' 11"'$  (1.803 m)   Wt 199 lb (90.3 kg)   BMI 27.75 kg/m    Subjective:    Patient ID: Shawn Russell, male    DOB: 1960/11/24, 56 y.o.   MRN: [id number]  HPI: Shawn Russell is a 56 y.o. male presenting on 04/12/2017 for Annual Exam   HPI Adult well exam Patient is coming in for follow-up today. He says that his only major issue that is having currently as he does snore at night and sometimes his wife things he stops breathing and might have sleep apnea. He would like to be tested for that but he says he cannot sleep anywhere else and wants to do a home sleep study. Patient denies any chest pain, shortness of breath, headaches or vision issues, abdominal complaints, diarrhea, nausea, vomiting, or joint issues.   Relevant past medical, surgical, family and social history reviewed and updated as indicated. Interim medical history since our last visit reviewed. Allergies and medications reviewed and updated.  Review of Systems  Constitutional: Positive for fatigue. Negative for chills and fever.  HENT: Negative for ear pain and tinnitus.   Eyes: Negative for pain and discharge.  Respiratory: Negative for cough, shortness of breath and wheezing.   Cardiovascular: Negative for chest pain, palpitations and leg swelling.  Gastrointestinal: Negative for abdominal pain, blood in stool, constipation and diarrhea.  Genitourinary: Negative for dysuria and hematuria.  Musculoskeletal: Negative for back pain, gait problem and myalgias.  Skin: Negative for rash.  Neurological: Negative for dizziness, weakness and headaches.  Psychiatric/Behavioral: Negative for suicidal ideas.  All other systems reviewed and are negative.   Per HPI unless specifically indicated above        Objective:    BP 131/73   Pulse 79   Temp 98.5 F (36.9 C) (Oral)   Ht '5\' 11"'$  (1.803 m)   Wt 199 lb (90.3 kg)   BMI 27.75 kg/m   Wt Readings from  Last 3 Encounters:  04/12/17 199 lb (90.3 kg)  07/02/15 196 lb (88.9 kg)  06/10/15 200 lb 9.6 oz (91 kg)    Physical Exam  Constitutional: He is oriented to person, place, and time. He appears well-developed and well-nourished. No distress.  Eyes: Conjunctivae are normal. No scleral icterus.  Neck: Neck supple. No thyromegaly present.  Cardiovascular: Normal rate, regular rhythm, normal heart sounds and intact distal pulses.   No murmur heard. Pulmonary/Chest: Effort normal and breath sounds normal. No respiratory distress. He has no wheezes.  Musculoskeletal: Normal range of motion. He exhibits no edema.  Lymphadenopathy:    He has no cervical adenopathy.  Neurological: He is alert and oriented to person, place, and time. Coordination normal.  Skin: Skin is warm and dry. No rash noted. He is not diaphoretic.  Psychiatric: He has a normal mood and affect. His behavior is normal.  Nursing note and vitals reviewed.       Assessment & Plan:   Problem List Items Addressed This Visit    None    Visit Diagnoses    Annual physical exam    -  Primary   Relevant Orders   CBC with Differential/Platelet   CMP14+EGFR   Lipid panel   HIV antibody   Overweight (BMI 25.0-29.9)       Relevant Orders   Lipid panel   Diabetes mellitus screening  Relevant Orders   CMP14+EGFR   Screening for HIV without presence of risk factors       Relevant Orders   HIV antibody   Sleep apnea, unspecified type       Patient has symptoms of sleep apnea, will do a home sleep study       Follow up plan: Return in about 1 year (around 04/12/2018), or if symptoms worsen or fail to improve.  Counseling provided for all of the vaccine components Orders Placed This Encounter  Procedures  . CBC with Differential/Platelet  . CMP14+EGFR  . Lipid panel  . HIV antibody    [name], MD [facility] 04/12/2017, 2:36 PM

## 2017-04-13 LAB — CMP14+EGFR
ALBUMIN: 4.4 g/dL (ref 3.5–5.5)
ALT: 14 IU/L (ref 0–44)
AST: 26 IU/L (ref 0–40)
Albumin/Globulin Ratio: 1.8 (ref 1.2–2.2)
Alkaline Phosphatase: 66 IU/L (ref 39–117)
BUN / CREAT RATIO: 7 — AB (ref 9–20)
BUN: 7 mg/dL (ref 6–24)
Bilirubin Total: 0.4 mg/dL (ref 0.0–1.2)
CALCIUM: 9.4 mg/dL (ref 8.7–10.2)
CO2: 23 mmol/L (ref 20–29)
Chloride: 106 mmol/L (ref 96–106)
Creatinine, Ser: 1 mg/dL (ref 0.76–1.27)
GFR calc non Af Amer: 84 mL/min/{1.73_m2} (ref >59)
GFR, EST AFRICAN AMERICAN: 97 mL/min/{1.73_m2} (ref >59)
Globulin, Total: 2.5 g/dL (ref 1.5–4.5)
Glucose: 86 mg/dL (ref 65–99)
Potassium: 5 mmol/L (ref 3.5–5.2)
Sodium: 147 mmol/L — ABNORMAL HIGH (ref 134–144)
TOTAL PROTEIN: 6.9 g/dL (ref 6.0–8.5)

## 2017-04-13 LAB — CBC WITH DIFFERENTIAL/PLATELET
Basophils Absolute: 0 10*3/uL (ref 0.0–0.2)
Basos: 0 %
EOS (ABSOLUTE): 0.1 10*3/uL (ref 0.0–0.4)
EOS: 2 %
HEMATOCRIT: 46.8 % (ref 37.5–51.0)
HEMOGLOBIN: 15.8 g/dL (ref 13.0–17.7)
IMMATURE GRANS (ABS): 0 10*3/uL (ref 0.0–0.1)
Immature Granulocytes: 0 %
LYMPHS: 35 %
Lymphocytes Absolute: 1.8 10*3/uL (ref 0.7–3.1)
MCH: 31.9 pg (ref 26.6–33.0)
MCHC: 33.8 g/dL (ref 31.5–35.7)
MCV: 94 fL (ref 79–97)
Monocytes Absolute: 0.5 10*3/uL (ref 0.1–0.9)
Monocytes: 11 %
NEUTROS PCT: 52 %
Neutrophils Absolute: 2.6 10*3/uL (ref 1.4–7.0)
Platelets: 224 10*3/uL (ref 150–379)
RBC: 4.96 x10E6/uL (ref 4.14–5.80)
RDW: 14.4 % (ref 12.3–15.4)
WBC: 5 10*3/uL (ref 3.4–10.8)

## 2017-04-13 LAB — LIPID PANEL
CHOL/HDL RATIO: 3.9 ratio (ref 0.0–5.0)
Cholesterol, Total: 191 mg/dL (ref 100–199)
HDL: 49 mg/dL (ref >39)
LDL Calculated: 128 mg/dL — ABNORMAL HIGH (ref 0–99)
Triglycerides: 70 mg/dL (ref 0–149)
VLDL Cholesterol Cal: 14 mg/dL (ref 5–40)

## 2017-04-13 LAB — HIV ANTIBODY (ROUTINE TESTING W REFLEX): HIV SCREEN 4TH GENERATION: NONREACTIVE

## 2017-04-14 ENCOUNTER — Telehealth: Payer: Self-pay | Admitting: Family Medicine

## 2017-04-14 NOTE — Telephone Encounter (Signed)
Pt notified of results Verbalizes understanding 

## 2017-04-18 DIAGNOSIS — G4730 Sleep apnea, unspecified: Secondary | ICD-10-CM | POA: Diagnosis not present

## 2017-05-03 DIAGNOSIS — N5201 Erectile dysfunction due to arterial insufficiency: Secondary | ICD-10-CM | POA: Diagnosis not present

## 2017-05-03 DIAGNOSIS — E291 Testicular hypofunction: Secondary | ICD-10-CM | POA: Diagnosis not present

## 2017-05-03 DIAGNOSIS — R3121 Asymptomatic microscopic hematuria: Secondary | ICD-10-CM | POA: Diagnosis not present

## 2017-05-30 ENCOUNTER — Ambulatory Visit (INDEPENDENT_AMBULATORY_CARE_PROVIDER_SITE_OTHER): Payer: PPO | Admitting: Family Medicine

## 2017-05-30 ENCOUNTER — Encounter: Payer: Self-pay | Admitting: Family Medicine

## 2017-05-30 VITALS — BP 142/89 | HR 67 | Temp 97.5°F | Ht 71.0 in | Wt 204.0 lb

## 2017-05-30 DIAGNOSIS — R42 Dizziness and giddiness: Secondary | ICD-10-CM

## 2017-05-30 DIAGNOSIS — W57XXXA Bitten or stung by nonvenomous insect and other nonvenomous arthropods, initial encounter: Secondary | ICD-10-CM

## 2017-05-30 MED ORDER — DOXYCYCLINE HYCLATE 100 MG PO TABS: 100.0000 mg | ORAL_TABLET | Freq: Two times a day (BID) | ORAL | 0 refills | Status: DC

## 2017-05-30 NOTE — Progress Notes (Signed)
Subjective:  Patient ID: Shawn Russell, male    DOB: 06-27-1961  Age: 56 y.o. MRN: [id number]  CC: Dizziness (pt here today c/o dizzy spells, headache and "eyes don't feel right". He states he had a tick that was attached and is concerned about Lyme disease.)   HPI Shawn Russell presents for Patient was bit by a tick in June. Actually he noted 2 bites one on the waistline and another on the leg. The site on the leg just inferior to the right knee on the medial side of the leg remained red for a long time. Since then he's noted that his eyes up in bloodshot and he's had pressure around them and vision has been blurred for about a month. The patient's dizziness has increased in frequency and severity. Last night it lasted about 10 minutes. His blood pressure went to 140/89 which is high for him. He says that he feels run down all the time which is new for him  Depression screen [facility] 2/9 05/30/2017 04/12/2017 06/10/2015  Decreased Interest 0 0 1  Down, Depressed, Hopeless 0 0 1  PHQ - 2 Score 0 0 2    History Shawn Russell has a past medical history of Chronic back pain; CVA (cerebral infarction); Degenerative disc disease, lumbar; Depression; Hepatitis C; Renal disorder; and Spinal stenosis.   He has a past surgical history that includes Knee surgery (Left); Rotator cuff repair (Right); Knee surgery (Right); surgery on ears (Bilateral); Ankle surgery (Right); l4-l5 fusion; kidney stone extraction (1989); maloney dilation (N/A, 02/14/2013); Esophagogastroduodenoscopy (egd) with propofol (N/A, 02/14/2013); and Colonoscopy with propofol (N/A, 02/14/2013).   His family history includes Heart disease in his father and mother; Osteoarthritis in his mother.He reports that he has been smoking Cigarettes.  He has a 40.00 pack-year smoking history. He has never used smokeless tobacco. He reports that he does not drink alcohol or use drugs.    ROS Review of Systems  Constitutional: Positive for fatigue. Negative  for chills, diaphoresis and fever.  HENT: Negative for rhinorrhea and sore throat.   Eyes: Positive for redness.  Respiratory: Negative for cough and shortness of breath.   Cardiovascular: Negative for chest pain.  Gastrointestinal: Negative for abdominal pain.  Musculoskeletal: Negative for arthralgias and myalgias.  Skin: Negative for rash.  Neurological: Positive for dizziness and headaches. Negative for weakness.    Objective:  BP (!) 142/89   Pulse 67   Temp (!) 97.5 F (36.4 C) (Oral)   Ht '5\' 11"'$  (1.803 m)   Wt 204 lb (92.5 kg)   BMI 28.45 kg/m   BP Readings from Last 3 Encounters:  05/30/17 (!) 142/89  04/12/17 131/73  07/02/15 122/78    Wt Readings from Last 3 Encounters:  05/30/17 204 lb (92.5 kg)  04/12/17 199 lb (90.3 kg)  07/02/15 196 lb (88.9 kg)     Physical Exam  Constitutional: He is oriented to person, place, and time. He appears well-developed and well-nourished. No distress.  HENT:  Head: Normocephalic and atraumatic.  Right Ear: External ear normal.  Left Ear: External ear normal.  Nose: Nose normal.  Mouth/Throat: Oropharynx is clear and moist.  Eyes: Pupils are equal, round, and reactive to light. Conjunctivae and EOM are normal.  Neck: Normal range of motion. Neck supple. No thyromegaly present.  Cardiovascular: Normal rate, regular rhythm and normal heart sounds.   No murmur heard. Pulmonary/Chest: Effort normal and breath sounds normal. No respiratory distress. He has no wheezes. He has no  rales.  Abdominal: Soft. Bowel sounds are normal. He exhibits no distension. There is no tenderness.  Lymphadenopathy:    He has no cervical adenopathy.  Neurological: He is alert and oriented to person, place, and time. He has normal reflexes.  Skin: Skin is warm and dry.  Psychiatric: He has a normal mood and affect. His behavior is normal. Judgment and thought content normal.      Assessment & Plan:   Shawn Russell was seen today for  dizziness.  Diagnoses and all orders for this visit:  Dizziness -     CBC with Differential/Platelet -     CMP14+EGFR -     Lyme Ab/Western Blot Reflex -     Hepatitis C antibody  Tick bite, initial encounter -     CBC with Differential/Platelet -     CMP14+EGFR -     Lyme Ab/Western Blot Reflex -     Hepatitis C antibody  Other orders -     doxycycline (VIBRA-TABS) 100 MG tablet; Take 1 tablet (100 mg total) by mouth 2 (two) times daily.   I am having Shawn Russell start on doxycycline. I am also having him maintain his escitalopram, HYDROmorphone, fentaNYL, aspirin, multivitamin, vitamin E, thiamine, vitamin C, cholecalciferol, and testosterone cypionate.  Allergies as of 05/30/2017   No Known Allergies     Medication List       Accurate as of 05/30/17  7:30 PM. Always use your most recent med list.          aspirin 325 MG tablet Take 325 mg by mouth daily.   cholecalciferol 1000 units tablet Commonly known as:  VITAMIN D Take 1,000 Units by mouth daily.   doxycycline 100 MG tablet Commonly known as:  VIBRA-TABS Take 1 tablet (100 mg total) by mouth 2 (two) times daily.   escitalopram 20 MG tablet Commonly known as:  LEXAPRO Take 20 mg by mouth daily.   fentaNYL 50 MCG/HR Commonly known as:  DURAGESIC - dosed mcg/hr Place 50 mcg onto the skin every other day.   HYDROmorphone 4 MG tablet Commonly known as:  DILAUDID Take by mouth every 6 (six) hours as needed for severe pain.   multivitamin tablet Take 1 tablet by mouth daily.   testosterone cypionate 200 MG/ML injection Commonly known as:  DEPOTESTOSTERONE CYPIONATE   thiamine 100 MG tablet Commonly known as:  VITAMIN B-1 Take 100 mg by mouth daily.   vitamin C 500 MG tablet Commonly known as:  ASCORBIC ACID Take 500 mg by mouth daily.   vitamin E 100 UNIT capsule Take 100 Units by mouth daily.        Follow-up: Return in about 2 weeks (around 06/13/2017).  Claretta Fraise, M.D.

## 2017-06-01 LAB — CBC WITH DIFFERENTIAL/PLATELET
BASOS: 0 %
Basophils Absolute: 0 10*3/uL (ref 0.0–0.2)
EOS (ABSOLUTE): 0.3 10*3/uL (ref 0.0–0.4)
EOS: 3 %
HEMATOCRIT: 49 % (ref 37.5–51.0)
Hemoglobin: 16.5 g/dL (ref 13.0–17.7)
Immature Grans (Abs): 0 10*3/uL (ref 0.0–0.1)
Immature Granulocytes: 0 %
LYMPHS ABS: 2.3 10*3/uL (ref 0.7–3.1)
Lymphs: 28 %
MCH: 31.4 pg (ref 26.6–33.0)
MCHC: 33.7 g/dL (ref 31.5–35.7)
MCV: 93 fL (ref 79–97)
MONOS ABS: 0.6 10*3/uL (ref 0.1–0.9)
Monocytes: 7 %
Neutrophils Absolute: 5 10*3/uL (ref 1.4–7.0)
Neutrophils: 62 %
Platelets: 215 10*3/uL (ref 150–379)
RBC: 5.26 x10E6/uL (ref 4.14–5.80)
RDW: 15.2 % (ref 12.3–15.4)
WBC: 8.2 10*3/uL (ref 3.4–10.8)

## 2017-06-01 LAB — CMP14+EGFR
A/G RATIO: 1.6 (ref 1.2–2.2)
ALK PHOS: 71 IU/L (ref 39–117)
ALT: 17 IU/L (ref 0–44)
AST: 28 IU/L (ref 0–40)
Albumin: 4.3 g/dL (ref 3.5–5.5)
BILIRUBIN TOTAL: 0.3 mg/dL (ref 0.0–1.2)
BUN/Creatinine Ratio: 9 (ref 9–20)
BUN: 10 mg/dL (ref 6–24)
CO2: 27 mmol/L (ref 20–29)
Calcium: 9.4 mg/dL (ref 8.7–10.2)
Chloride: 98 mmol/L (ref 96–106)
Creatinine, Ser: 1.13 mg/dL (ref 0.76–1.27)
GFR calc Af Amer: 84 mL/min/{1.73_m2} (ref >59)
GFR calc non Af Amer: 72 mL/min/{1.73_m2} (ref >59)
GLOBULIN, TOTAL: 2.7 g/dL (ref 1.5–4.5)
Glucose: 85 mg/dL (ref 65–99)
POTASSIUM: 5.1 mmol/L (ref 3.5–5.2)
SODIUM: 139 mmol/L (ref 134–144)
Total Protein: 7 g/dL (ref 6.0–8.5)

## 2017-06-01 LAB — LYME AB/WESTERN BLOT REFLEX: LYME DISEASE AB, QUANT, IGM: <0.8 index (ref 0.00–0.79)

## 2017-06-01 LAB — HEPATITIS C ANTIBODY: Hep C Virus Ab: >11 s/co ratio — ABNORMAL HIGH (ref 0.0–0.9)

## 2017-06-02 DIAGNOSIS — Z79891 Long term (current) use of opiate analgesic: Secondary | ICD-10-CM | POA: Diagnosis not present

## 2017-06-02 DIAGNOSIS — M961 Postlaminectomy syndrome, not elsewhere classified: Secondary | ICD-10-CM | POA: Diagnosis not present

## 2017-06-02 DIAGNOSIS — M47812 Spondylosis without myelopathy or radiculopathy, cervical region: Secondary | ICD-10-CM | POA: Diagnosis not present

## 2017-06-02 DIAGNOSIS — G894 Chronic pain syndrome: Secondary | ICD-10-CM | POA: Diagnosis not present

## 2017-06-06 ENCOUNTER — Other Ambulatory Visit: Payer: PPO

## 2017-06-06 DIAGNOSIS — R768 Other specified abnormal immunological findings in serum: Secondary | ICD-10-CM

## 2017-06-08 LAB — HEPATITIS C VRS RNA DETECT BY PCR-QUAL: HCV RNA NAA Qualitative: NEGATIVE

## 2017-07-05 DIAGNOSIS — R1084 Generalized abdominal pain: Secondary | ICD-10-CM | POA: Diagnosis not present

## 2017-07-05 DIAGNOSIS — R3121 Asymptomatic microscopic hematuria: Secondary | ICD-10-CM | POA: Diagnosis not present

## 2017-07-28 DIAGNOSIS — Z79891 Long term (current) use of opiate analgesic: Secondary | ICD-10-CM | POA: Diagnosis not present

## 2017-07-28 DIAGNOSIS — M961 Postlaminectomy syndrome, not elsewhere classified: Secondary | ICD-10-CM | POA: Diagnosis not present

## 2017-07-28 DIAGNOSIS — M47812 Spondylosis without myelopathy or radiculopathy, cervical region: Secondary | ICD-10-CM | POA: Diagnosis not present

## 2017-07-28 DIAGNOSIS — G894 Chronic pain syndrome: Secondary | ICD-10-CM | POA: Diagnosis not present

## 2017-07-28 DIAGNOSIS — M25562 Pain in left knee: Secondary | ICD-10-CM | POA: Diagnosis not present

## 2017-07-28 DIAGNOSIS — F329 Major depressive disorder, single episode, unspecified: Secondary | ICD-10-CM | POA: Diagnosis not present

## 2017-07-28 DIAGNOSIS — M150 Primary generalized (osteo)arthritis: Secondary | ICD-10-CM | POA: Diagnosis not present

## 2017-08-04 ENCOUNTER — Encounter: Payer: Self-pay | Admitting: Family Medicine

## 2017-08-04 ENCOUNTER — Ambulatory Visit (INDEPENDENT_AMBULATORY_CARE_PROVIDER_SITE_OTHER): Payer: PPO

## 2017-08-04 ENCOUNTER — Ambulatory Visit (INDEPENDENT_AMBULATORY_CARE_PROVIDER_SITE_OTHER): Payer: PPO | Admitting: Family Medicine

## 2017-08-04 VITALS — BP 130/89 | HR 72 | Temp 98.3°F | Ht 71.0 in | Wt 210.8 lb

## 2017-08-04 DIAGNOSIS — R1084 Generalized abdominal pain: Secondary | ICD-10-CM | POA: Diagnosis not present

## 2017-08-04 MED ORDER — PANTOPRAZOLE SODIUM 40 MG PO TBEC
40.0000 mg | DELAYED_RELEASE_TABLET | Freq: Every day | ORAL | 3 refills | Status: DC
Start: 2017-08-04 — End: 2017-12-02

## 2017-08-04 NOTE — Patient Instructions (Addendum)
Great to meet you!  Come back as needed, lets go ahead and get you checked out by GI.    Abdominal Pain, Adult Many things can cause belly (abdominal) pain. Most times, belly pain is not dangerous. Many cases of belly pain can be watched and treated at home. Sometimes belly pain is serious, though. Your doctor will try to find the cause of your belly pain. Follow these instructions at home:  Take over-the-counter and prescription medicines only as told by your doctor. Do not take medicines that help you poop (laxatives) unless told to by your doctor.  Drink enough fluid to keep your pee (urine) clear or pale yellow.  Watch your belly pain for any changes.  Keep all follow-up visits as told by your doctor. This is important. Contact a doctor if:  Your belly pain changes or gets worse.  You are not hungry, or you lose weight without trying.  You are having trouble pooping (constipated) or have watery poop (diarrhea) for more than 2-3 days.  You have pain when you pee or poop.  Your belly pain wakes you up at night.  Your pain gets worse with meals, after eating, or with certain foods.  You are throwing up and cannot keep anything down.  You have a fever. Get help right away if:  Your pain does not go away as soon as your doctor says it should.  You cannot stop throwing up.  Your pain is only in areas of your belly, such as the right side or the left lower part of the belly.  You have bloody or black poop, or poop that looks like tar.  You have very bad pain, cramping, or bloating in your belly.  You have signs of not having enough fluid or water in your body (dehydration), such as: ? Dark pee, very little pee, or no pee. ? Cracked lips. ? Dry mouth. ? Sunken eyes. ? Sleepiness. ? Weakness. This information is not intended to replace advice given to you by your health care provider. Make sure you discuss any questions you have with your health care provider. Document  Released: 03/21/2008 Document Revised: 04/22/2016 Document Reviewed: 03/16/2016 Elsevier Interactive Patient Education  2017 [employer].

## 2017-08-04 NOTE — Progress Notes (Signed)
   HPI  Patient presents today with generalized abdominal pain.  Patient explains that he has had this pain for about 2 weeks.  He did take Prilosec for a time and had the pain eased off.  He stopped after 14 days per package recommendation and has developed worsening pain since that time.  He describes bilateral and generalized abdominal pain described as persistent dull pain.  There are no aggravating or alleviating.  Patient does not take NSAIDs, he does take chronic narcotics for chronic musculoskeletal pain.  He denies any constipation or diarrhea.  He has had nausea but no vomiting.  PMH: Smoking status noted ROS: Per HPI  Objective: BP 130/89   Pulse 72   Temp 98.3 F (36.8 C) (Oral)   Ht 5' 11" (1.803 m)   Wt 210 lb 12.8 oz (95.6 kg)   BMI 29.40 kg/m  Gen: NAD, alert, cooperative with exam HEENT: NCAT, EOMI, PERRL CV: RRR, good S1/S2, no murmur Resp: CTABL, no wheezes, non-labored Abd: Positive bowel sounds, soft, no guarding, tenderness to palpation throughout Ext: No edema, warm Neuro: Alert and oriented, No gross deficits  Assessment and plan:  #Generalized abdominal pain Unclear etiology, however I am more concerned given his chronic medical conditions and slight improvement with PPI with now worsening. Labs, plain film Start PPI GI referral, previous colonoscopy with only polyps, EGD with hiatal hernia, Schatzki ring, and benign pathology of gastric mucosa      Orders Placed This Encounter  Procedures  . DG Abd 1 View    Standing Status:   Future    Number of Occurrences:   1    Standing Expiration Date:   08/04/2018    Order Specific Question:   Reason for Exam (SYMPTOM  OR DIAGNOSIS REQUIRED)    Answer:   diffuse abd pain    Order Specific Question:   Preferred imaging location?    Answer:   External  . CMP14+EGFR  . CBC with Differential/Platelet  . Ambulatory referral to Gastroenterology    Referral Priority:   Routine    Referral Type:    Consultation    Referral Reason:   Specialty Services Required    Referred to Provider:   Rourk, Robert M, MD    Number of Visits Requested:   1    Meds ordered this encounter  Medications  . pantoprazole (PROTONIX) 40 MG tablet    Sig: Take 1 tablet (40 mg total) by mouth daily.    Dispense:  30 tablet    Refill:  3    Sam Bradshaw, MD [facility] 08/04/2017, 2:35 PM     

## 2017-08-05 LAB — CMP14+EGFR
ALBUMIN: 4.4 g/dL (ref 3.5–5.5)
ALT: 14 IU/L (ref 0–44)
AST: 23 IU/L (ref 0–40)
Albumin/Globulin Ratio: 1.6 (ref 1.2–2.2)
Alkaline Phosphatase: 68 IU/L (ref 39–117)
BUN / CREAT RATIO: 10 (ref 9–20)
BUN: 9 mg/dL (ref 6–24)
Bilirubin Total: 0.5 mg/dL (ref 0.0–1.2)
CALCIUM: 9.2 mg/dL (ref 8.7–10.2)
CO2: 27 mmol/L (ref 20–29)
CREATININE: 0.93 mg/dL (ref 0.76–1.27)
Chloride: 101 mmol/L (ref 96–106)
GFR calc Af Amer: 106 mL/min/{1.73_m2} (ref >59)
GFR, EST NON AFRICAN AMERICAN: 91 mL/min/{1.73_m2} (ref >59)
GLOBULIN, TOTAL: 2.8 g/dL (ref 1.5–4.5)
Glucose: 83 mg/dL (ref 65–99)
Potassium: 5 mmol/L (ref 3.5–5.2)
SODIUM: 143 mmol/L (ref 134–144)
Total Protein: 7.2 g/dL (ref 6.0–8.5)

## 2017-08-05 LAB — CBC WITH DIFFERENTIAL/PLATELET
Basophils Absolute: 0 10*3/uL (ref 0.0–0.2)
Basos: 0 %
EOS (ABSOLUTE): 0.1 10*3/uL (ref 0.0–0.4)
Eos: 2 %
Hematocrit: 48.9 % (ref 37.5–51.0)
Hemoglobin: 17 g/dL (ref 13.0–17.7)
IMMATURE GRANS (ABS): 0 10*3/uL (ref 0.0–0.1)
IMMATURE GRANULOCYTES: 0 %
LYMPHS: 32 %
Lymphocytes Absolute: 2.2 10*3/uL (ref 0.7–3.1)
MCH: 32.7 pg (ref 26.6–33.0)
MCHC: 34.8 g/dL (ref 31.5–35.7)
MCV: 94 fL (ref 79–97)
MONOCYTES: 7 %
Monocytes Absolute: 0.5 10*3/uL (ref 0.1–0.9)
NEUTROS PCT: 59 %
Neutrophils Absolute: 4.2 10*3/uL (ref 1.4–7.0)
PLATELETS: 219 10*3/uL (ref 150–379)
RBC: 5.2 x10E6/uL (ref 4.14–5.80)
RDW: 15.4 % (ref 12.3–15.4)
WBC: 7.1 10*3/uL (ref 3.4–10.8)

## 2017-08-07 ENCOUNTER — Encounter: Payer: Self-pay | Admitting: Internal Medicine

## 2017-09-20 ENCOUNTER — Ambulatory Visit: Payer: Medicare Other | Admitting: Gastroenterology

## 2017-09-22 DIAGNOSIS — M47812 Spondylosis without myelopathy or radiculopathy, cervical region: Secondary | ICD-10-CM | POA: Diagnosis not present

## 2017-09-22 DIAGNOSIS — G894 Chronic pain syndrome: Secondary | ICD-10-CM | POA: Diagnosis not present

## 2017-09-22 DIAGNOSIS — Z79891 Long term (current) use of opiate analgesic: Secondary | ICD-10-CM | POA: Diagnosis not present

## 2017-09-22 DIAGNOSIS — M961 Postlaminectomy syndrome, not elsewhere classified: Secondary | ICD-10-CM | POA: Diagnosis not present

## 2017-11-24 DIAGNOSIS — G894 Chronic pain syndrome: Secondary | ICD-10-CM | POA: Diagnosis not present

## 2017-11-24 DIAGNOSIS — M961 Postlaminectomy syndrome, not elsewhere classified: Secondary | ICD-10-CM | POA: Diagnosis not present

## 2017-11-24 DIAGNOSIS — Z79891 Long term (current) use of opiate analgesic: Secondary | ICD-10-CM | POA: Diagnosis not present

## 2017-11-24 DIAGNOSIS — M47812 Spondylosis without myelopathy or radiculopathy, cervical region: Secondary | ICD-10-CM | POA: Diagnosis not present

## 2017-12-02 ENCOUNTER — Other Ambulatory Visit: Payer: Self-pay | Admitting: Family Medicine

## 2017-12-06 ENCOUNTER — Other Ambulatory Visit: Payer: Self-pay | Admitting: Family Medicine

## 2017-12-29 ENCOUNTER — Ambulatory Visit: Payer: PPO | Admitting: Family Medicine

## 2017-12-29 ENCOUNTER — Ambulatory Visit (INDEPENDENT_AMBULATORY_CARE_PROVIDER_SITE_OTHER): Payer: PPO | Admitting: Family Medicine

## 2017-12-29 ENCOUNTER — Encounter: Payer: Self-pay | Admitting: Family Medicine

## 2017-12-29 VITALS — BP 117/74 | HR 75 | Temp 98.3°F | Ht 71.0 in | Wt 210.0 lb

## 2017-12-29 DIAGNOSIS — J441 Chronic obstructive pulmonary disease with (acute) exacerbation: Secondary | ICD-10-CM

## 2017-12-29 MED ORDER — PREDNISONE 20 MG PO TABS: ORAL_TABLET | ORAL | 0 refills | Status: DC

## 2017-12-29 NOTE — Progress Notes (Signed)
BP 117/74   Pulse 75   Temp 98.3 F (36.8 C) (Oral)   Ht 5\' 11"  (1.803 m)   Wt 210 lb (95.3 kg)   BMI 29.29 kg/m    Subjective:    Patient ID: Shawn Russell, male    DOB: 09-03-1961, 57 y.o.   MRN: [id number]  HPI: Shawn Russell is a 57 y.o. male presenting on 12/29/2017 for congestion and cough   HPI Pt is here with cough and congestion for the last 36 hours. States it is productive with green mucus and he feels sinus pressure at times. Denies any wheezing or fever. Coughing is worse at night and keeps him from getting good sleep. States he feels he can't get a good breath because of his congestion. He has used Mucinex once and it did help loosen the mucus. Pt is a regular smoker and does not plan on quitting. Has not been around any sick contacts. Did not get his flu shot this year.   Relevant past medical, surgical, family and social history reviewed and updated as indicated. Interim medical history since our last visit reviewed. Allergies and medications reviewed and updated.  Review of Systems  Constitutional: Negative for activity change, appetite change, chills and fever.  HENT: Positive for congestion, hearing loss (left ear has hearing loss), rhinorrhea, sinus pressure and sinus pain. Negative for ear discharge and sore throat.   Eyes: Negative for pain and itching.  Respiratory: Positive for cough and shortness of breath. Negative for chest tightness and wheezing.   Cardiovascular: Negative for chest pain and leg swelling.  Gastrointestinal: Negative for abdominal pain, constipation, diarrhea and vomiting.  Genitourinary: Negative for decreased urine volume, difficulty urinating and urgency.    Per HPI unless specifically indicated above   Allergies as of 12/29/2017   No Known Allergies     Medication List        Accurate as of 12/29/17  4:51 PM. Always use your most recent med list.          aspirin 325 MG tablet Take 325 mg by mouth daily.     escitalopram 20 MG tablet Commonly known as:  LEXAPRO Take 20 mg by mouth daily.   fentaNYL 50 MCG/HR Commonly known as:  DURAGESIC - dosed mcg/hr Place 50 mcg onto the skin every other day.   HYDROmorphone 4 MG tablet Commonly known as:  DILAUDID Take by mouth every 6 (six) hours as needed for severe pain.   multivitamin tablet Take 1 tablet by mouth daily.   pantoprazole 40 MG tablet Commonly known as:  PROTONIX TAKE 1 TABLET BY MOUTH EVERY DAY   predniSONE 20 MG tablet Commonly known as:  DELTASONE 2 po at same time daily for 5 days   testosterone cypionate 200 MG/ML injection Commonly known as:  DEPOTESTOSTERONE CYPIONATE          Objective:    BP 117/74   Pulse 75   Temp 98.3 F (36.8 C) (Oral)   Ht 5\' 11"  (1.803 m)   Wt 210 lb (95.3 kg)   BMI 29.29 kg/m   Wt Readings from Last 3 Encounters:  12/29/17 210 lb (95.3 kg)  08/04/17 210 lb 12.8 oz (95.6 kg)  05/30/17 204 lb (92.5 kg)    Physical Exam  Constitutional: He is oriented to person, place, and time. He appears well-developed and well-nourished.  HENT:  Right Ear: External ear and ear canal normal. No drainage. Tympanic membrane is scarred. No middle ear  effusion. Decreased hearing is noted.  Left Ear: Hearing, tympanic membrane, external ear and ear canal normal.  Nose: Right sinus exhibits no maxillary sinus tenderness and no frontal sinus tenderness. Left sinus exhibits no maxillary sinus tenderness and no frontal sinus tenderness.  Mouth/Throat: Oropharynx is clear and moist and mucous membranes are normal.  Neck: Neck supple. No thyromegaly present.  Cardiovascular: Normal rate, regular rhythm and normal heart sounds.  Pulmonary/Chest: Effort normal and breath sounds normal. No respiratory distress. He has no wheezes. He has no rales.  Lymphadenopathy:    He has no cervical adenopathy.  Neurological: He is alert and oriented to person, place, and time.  Skin: Skin is warm, dry and intact.   Vitals reviewed.     Assessment & Plan:   Problem List Items Addressed This Visit    None    Visit Diagnoses    COPD exacerbation ([place])    -  Primary   Relevant Medications   predniSONE (DELTASONE) 20 MG tablet      Instruct pt to use Flonase that he has at home. Order Prednisone for mild COPD exacerbation. Continue supportive care, using Mucinex and fluids, etc. Encourage smoking cessation. Pt refused inhaler at this time.  Follow up plan: Return if symptoms worsen or fail to improve.  Counseling provided for all of the vaccine components No orders of the defined types were placed in this encounter.   Patient seen and examined with Chaney Malling PA student, agree with assessment and plan above. [name], MD [facility] 01/02/2018, 10:40 PM

## 2018-01-19 DIAGNOSIS — G894 Chronic pain syndrome: Secondary | ICD-10-CM | POA: Diagnosis not present

## 2018-01-19 DIAGNOSIS — Z79891 Long term (current) use of opiate analgesic: Secondary | ICD-10-CM | POA: Diagnosis not present

## 2018-01-19 DIAGNOSIS — M961 Postlaminectomy syndrome, not elsewhere classified: Secondary | ICD-10-CM | POA: Diagnosis not present

## 2018-01-19 DIAGNOSIS — M47812 Spondylosis without myelopathy or radiculopathy, cervical region: Secondary | ICD-10-CM | POA: Diagnosis not present

## 2018-02-20 ENCOUNTER — Encounter: Payer: Self-pay | Admitting: Family Medicine

## 2018-02-20 ENCOUNTER — Ambulatory Visit: Payer: PPO | Admitting: Family Medicine

## 2018-02-20 VITALS — BP 138/89 | HR 78 | Temp 97.8°F | Ht 71.0 in | Wt 202.0 lb

## 2018-02-20 DIAGNOSIS — G479 Sleep disorder, unspecified: Secondary | ICD-10-CM

## 2018-02-20 MED ORDER — TRAZODONE HCL 50 MG PO TABS: 25.0000 mg | ORAL_TABLET | Freq: Every evening | ORAL | 0 refills | Status: DC | PRN

## 2018-02-20 NOTE — Progress Notes (Signed)
Subjective: CC: Sleep issue PCP: Dettinger, Fransisca Kaufmann, MD QMG:[id number] Shawn Russell is a 57 y.o. male presenting to clinic today for:  1. Sleep difficulties Patient reports that he has been having difficulty with falling asleep and staying asleep since mid March.  He notes that this occurred after discontinuing Lexapro 20 mg, which was affecting his stomach.  He notes that as a result he has been tired during the day.  He has greatly decreased his caffeine intake in efforts to have better sleep.  He is drinking about 2 cups of coffee each morning.  He seldomly consumes alcohol.  No drug use.  He does report some snoring but denies apneic episodes.  He notes that he sleeps in a quiet room and does not watch television or read in bed.  He at most is getting about 2 hours of sleep per night.  No nocturia.  He has been using over-the-counter medications with little improvement in sleep.  He is also tried 1 of his girlfriends [place] with no good sleep.  He currently sees pain management for chronic back pain and receives fentanyl and Dilaudid.   ROS: Per HPI  No Known Allergies Past Medical History:  Diagnosis Date  . Chronic back pain   . CVA (cerebral infarction)    per patient  . Degenerative disc disease, lumbar   . Depression   . Hepatitis C    blood transfusion 1988  . Renal disorder   . Spinal stenosis     Current Outpatient Medications:  .  aspirin 325 MG tablet, Take 325 mg by mouth daily., Disp: , Rfl:  .  fentaNYL (DURAGESIC - DOSED MCG/HR) 50 MCG/HR, Place 50 mcg onto the skin every other day., Disp: , Rfl:  .  HYDROmorphone (DILAUDID) 4 MG tablet, Take by mouth every 6 (six) hours as needed for severe pain., Disp: , Rfl:  .  Multiple Vitamin (MULTIVITAMIN) tablet, Take 1 tablet by mouth daily., Disp: , Rfl:  .  penicillin v potassium (VEETID) 500 MG tablet, , Disp: , Rfl:  .  testosterone cypionate (DEPOTESTOSTERONE CYPIONATE) 200 MG/ML injection, , Disp: , Rfl:    Social  History   Socioeconomic History  . Marital status: Married    Spouse name: Not on file  . Number of children: Not on file  . Years of education: Not on file  . Highest education level: Not on file  Occupational History  . Not on file  Social Needs  . Financial resource strain: Not on file  . Food insecurity:    Worry: Not on file    Inability: Not on file  . Transportation needs:    Medical: Not on file    Non-medical: Not on file  Tobacco Use  . Smoking status: Current Every Day Smoker    Packs/day: 1.00    Years: 40.00    Pack years: 40.00    Types: Cigarettes  . Smokeless tobacco: Never Used  Substance and Sexual Activity  . Alcohol use: No  . Drug use: No  . Sexual activity: Not on file  Lifestyle  . Physical activity:    Days per week: Not on file    Minutes per session: Not on file  . Stress: Not on file  Relationships  . Social connections:    Talks on phone: Not on file    Gets together: Not on file    Attends religious service: Not on file    Active member of club or organization:  Not on file    Attends meetings of clubs or organizations: Not on file    Relationship status: Not on file  . Intimate partner violence:    Fear of current or ex partner: Not on file    Emotionally abused: Not on file    Physically abused: Not on file    Forced sexual activity: Not on file  Other Topics Concern  . Not on file  Social History Narrative  . Not on file   Family History  Problem Relation Age of Onset  . Heart disease Mother   . Osteoarthritis Mother   . Heart disease Father   . Colon cancer Neg Hx   . Liver disease Neg Hx     Objective: Office vital signs reviewed. BP 138/89   Pulse 78   Temp 97.8 F (36.6 C) (Oral)   Ht 5\' 11"  (0.923 m)   Wt 202 lb (91.6 kg)   BMI 28.17 kg/m   Physical Examination:  General: Awake, alert, well nourished, No acute distress Neck: Girth not enlarged.   Psych: Mood stable, speech normal, affect appropriate, does  interrupt frequently during the visit.  No excessive sedation appreciated.  Does not appear to be responding to internal stimuli.  Depression screen [facility] 2/9 02/20/2018 12/29/2017 08/04/2017  Decreased Interest 0 0 0  Down, Depressed, Hopeless 0 0 0  PHQ - 2 Score 0 0 0   GAD 7 : Generalized Anxiety Score 02/20/2018  Nervous, Anxious, on Edge 3  Control/stop worrying 3  Worry too much - different things 2  Trouble relaxing 2  Restless 2  Easily annoyed or irritable 3  Afraid - awful might happen 2  Total GAD 7 Score 17  Anxiety Difficulty Very difficult   Mood questionnaire: negative  Assessment/ Plan: 57 y.o. male   1. Difficulty sleeping Patient with difficulty sleeping.  It seems to be a result of discontinuation of his SSRI.  He declined trying an alternative within the class.  He does wish to have a sleep aid.  We discussed that benzodiazepine class and similar would not be an option given his use of opioid medications.  I offered him trazodone versus Belsomra versus Razorem.  We will start trazodone 25 to 50 mg nightly.  May need to increase.  We also discussed consideration for sleep study evaluation.  Patient wishes to see how he will do on oral medications.  He will follow-up in 2 weeks with PCP for recheck.  Home care instructions reviewed.  Sleep hygiene handout provided.  Meds ordered this encounter  Medications  . traZODone (DESYREL) 50 MG tablet    Sig: Take 0.5-1 tablets (25-50 mg total) by mouth at bedtime as needed for sleep.    Dispense:  30 tablet    Refill:  Kendrick, DO [place] [phone]

## 2018-02-20 NOTE — Patient Instructions (Addendum)
We discussed different options for sleep aids.  Because you have failed many of the over-the-counter remedies and have greatly reduced her caffeine, we will proceed with prescribed medication.  I have prescribed you trazodone.  You may take 1/2 to 1 tablet 1 hour prior to bedtime.  Make sure that you have at least 8 hours of allotted sleep time.  Plan to follow-up with Dr.Dettinger in the next 2 to 4 weeks for sleep recheck.  You may need your medication dose increased.  We also discussed consideration for a sleep specialist.  Insomnia Insomnia is a sleep disorder that makes it difficult to fall asleep or to stay asleep. Insomnia can cause tiredness (fatigue), low energy, difficulty concentrating, mood swings, and poor performance at work or school. There are three different ways to classify insomnia:  Difficulty falling asleep.  Difficulty staying asleep.  Waking up too early in the morning.  Any type of insomnia can be long-term (chronic) or short-term (acute). Both are common. Short-term insomnia usually lasts for three months or less. Chronic insomnia occurs at least three times a week for longer than three months. What are the causes? Insomnia may be caused by another condition, situation, or substance, such as:  Anxiety.  Certain medicines.  Gastroesophageal reflux disease (GERD) or other gastrointestinal conditions.  Asthma or other breathing conditions.  Restless legs syndrome, sleep apnea, or other sleep disorders.  Chronic pain.  Menopause. This may include hot flashes.  Stroke.  Abuse of alcohol, tobacco, or illegal drugs.  Depression.  Caffeine.  Neurological disorders, such as Alzheimer disease.  An overactive thyroid (hyperthyroidism).  The cause of insomnia may not be known. What increases the risk? Risk factors for insomnia include:  Gender. Women are more commonly affected than men.  Age. Insomnia is more common as you get older.  Stress. This may  involve your professional or personal life.  Income. Insomnia is more common in people with lower income.  Lack of exercise.  Irregular work schedule or night shifts.  Traveling between different time zones.  What are the signs or symptoms? If you have insomnia, trouble falling asleep or trouble staying asleep is the main symptom. This may lead to other symptoms, such as:  Feeling fatigued.  Feeling nervous about going to sleep.  Not feeling rested in the morning.  Having trouble concentrating.  Feeling irritable, anxious, or depressed.  How is this treated? Treatment for insomnia depends on the cause. If your insomnia is caused by an underlying condition, treatment will focus on addressing the condition. Treatment may also include:  Medicines to help you sleep.  Counseling or therapy.  Lifestyle adjustments.  Follow these instructions at home:  Take medicines only as directed by your health care provider.  Keep regular sleeping and waking hours. Avoid naps.  Keep a sleep diary to help you and your health care provider figure out what could be causing your insomnia. Include: ? When you sleep. ? When you wake up during the night. ? How well you sleep. ? How rested you feel the next day. ? Any side effects of medicines you are taking. ? What you eat and drink.  Make your bedroom a comfortable place where it is easy to fall asleep: ? Put up shades or special blackout curtains to block light from outside. ? Use a white noise machine to block noise. ? Keep the temperature cool.  Exercise regularly as directed by your health care provider. Avoid exercising right before bedtime.  Use relaxation  techniques to manage stress. Ask your health care provider to suggest some techniques that may work well for you. These may include: ? Breathing exercises. ? Routines to release muscle tension. ? Visualizing peaceful scenes.  Cut back on alcohol, caffeinated beverages, and  cigarettes, especially close to bedtime. These can disrupt your sleep.  Do not overeat or eat spicy foods right before bedtime. This can lead to digestive discomfort that can make it hard for you to sleep.  Limit screen use before bedtime. This includes: ? Watching TV. ? Using your smartphone, tablet, and computer.  Stick to a routine. This can help you fall asleep faster. Try to do a quiet activity, brush your teeth, and go to bed at the same time each night.  Get out of bed if you are still awake after 15 minutes of trying to sleep. Keep the lights down, but try reading or doing a quiet activity. When you feel sleepy, go back to bed.  Make sure that you drive carefully. Avoid driving if you feel very sleepy.  Keep all follow-up appointments as directed by your health care provider. This is important. Contact a health care provider if:  You are tired throughout the day or have trouble in your daily routine due to sleepiness.  You continue to have sleep problems or your sleep problems get worse. Get help right away if:  You have serious thoughts about hurting yourself or someone else. This information is not intended to replace advice given to you by your health care provider. Make sure you discuss any questions you have with your health care provider. Document Released: 09/30/2000 Document Revised: 03/04/2016 Document Reviewed: 07/04/2014 Elsevier Interactive Patient Education  [employer].

## 2018-03-05 ENCOUNTER — Other Ambulatory Visit: Payer: Self-pay | Admitting: Family Medicine

## 2018-03-16 DIAGNOSIS — G894 Chronic pain syndrome: Secondary | ICD-10-CM | POA: Diagnosis not present

## 2018-03-16 DIAGNOSIS — Z79891 Long term (current) use of opiate analgesic: Secondary | ICD-10-CM | POA: Diagnosis not present

## 2018-03-16 DIAGNOSIS — M961 Postlaminectomy syndrome, not elsewhere classified: Secondary | ICD-10-CM | POA: Diagnosis not present

## 2018-03-16 DIAGNOSIS — M47812 Spondylosis without myelopathy or radiculopathy, cervical region: Secondary | ICD-10-CM | POA: Diagnosis not present

## 2018-03-21 ENCOUNTER — Other Ambulatory Visit: Payer: Self-pay | Admitting: Family Medicine

## 2018-04-03 DIAGNOSIS — W01198A Fall on same level from slipping, tripping and stumbling with subsequent striking against other object, initial encounter: Secondary | ICD-10-CM | POA: Diagnosis not present

## 2018-04-03 DIAGNOSIS — Z23 Encounter for immunization: Secondary | ICD-10-CM | POA: Diagnosis not present

## 2018-04-03 DIAGNOSIS — Z7982 Long term (current) use of aspirin: Secondary | ICD-10-CM | POA: Diagnosis not present

## 2018-04-03 DIAGNOSIS — S0101XA Laceration without foreign body of scalp, initial encounter: Secondary | ICD-10-CM | POA: Diagnosis not present

## 2018-04-03 DIAGNOSIS — F17200 Nicotine dependence, unspecified, uncomplicated: Secondary | ICD-10-CM | POA: Diagnosis not present

## 2018-04-03 DIAGNOSIS — M069 Rheumatoid arthritis, unspecified: Secondary | ICD-10-CM | POA: Diagnosis not present

## 2018-04-12 DIAGNOSIS — Z4802 Encounter for removal of sutures: Secondary | ICD-10-CM | POA: Diagnosis not present

## 2018-05-09 DIAGNOSIS — E230 Hypopituitarism: Secondary | ICD-10-CM | POA: Diagnosis not present

## 2018-05-09 DIAGNOSIS — N4340 Spermatocele of epididymis, unspecified: Secondary | ICD-10-CM | POA: Diagnosis not present

## 2018-05-09 DIAGNOSIS — N5201 Erectile dysfunction due to arterial insufficiency: Secondary | ICD-10-CM | POA: Diagnosis not present

## 2018-05-11 DIAGNOSIS — M961 Postlaminectomy syndrome, not elsewhere classified: Secondary | ICD-10-CM | POA: Diagnosis not present

## 2018-05-11 DIAGNOSIS — M47812 Spondylosis without myelopathy or radiculopathy, cervical region: Secondary | ICD-10-CM | POA: Diagnosis not present

## 2018-05-11 DIAGNOSIS — Z79891 Long term (current) use of opiate analgesic: Secondary | ICD-10-CM | POA: Diagnosis not present

## 2018-05-11 DIAGNOSIS — G894 Chronic pain syndrome: Secondary | ICD-10-CM | POA: Diagnosis not present

## 2018-07-12 DIAGNOSIS — Z79891 Long term (current) use of opiate analgesic: Secondary | ICD-10-CM | POA: Diagnosis not present

## 2018-07-12 DIAGNOSIS — G894 Chronic pain syndrome: Secondary | ICD-10-CM | POA: Diagnosis not present

## 2018-07-12 DIAGNOSIS — M961 Postlaminectomy syndrome, not elsewhere classified: Secondary | ICD-10-CM | POA: Diagnosis not present

## 2018-07-12 DIAGNOSIS — M47812 Spondylosis without myelopathy or radiculopathy, cervical region: Secondary | ICD-10-CM | POA: Diagnosis not present

## 2018-09-06 DIAGNOSIS — M961 Postlaminectomy syndrome, not elsewhere classified: Secondary | ICD-10-CM | POA: Diagnosis not present

## 2018-09-06 DIAGNOSIS — G894 Chronic pain syndrome: Secondary | ICD-10-CM | POA: Diagnosis not present

## 2018-09-06 DIAGNOSIS — M47812 Spondylosis without myelopathy or radiculopathy, cervical region: Secondary | ICD-10-CM | POA: Diagnosis not present

## 2018-09-06 DIAGNOSIS — Z79891 Long term (current) use of opiate analgesic: Secondary | ICD-10-CM | POA: Diagnosis not present

## 2018-11-02 DIAGNOSIS — M961 Postlaminectomy syndrome, not elsewhere classified: Secondary | ICD-10-CM | POA: Diagnosis not present

## 2018-11-02 DIAGNOSIS — Z79891 Long term (current) use of opiate analgesic: Secondary | ICD-10-CM | POA: Diagnosis not present

## 2018-11-02 DIAGNOSIS — G894 Chronic pain syndrome: Secondary | ICD-10-CM | POA: Diagnosis not present

## 2018-11-02 DIAGNOSIS — M47812 Spondylosis without myelopathy or radiculopathy, cervical region: Secondary | ICD-10-CM | POA: Diagnosis not present

## 2018-11-07 DIAGNOSIS — N4340 Spermatocele of epididymis, unspecified: Secondary | ICD-10-CM | POA: Diagnosis not present

## 2018-11-15 ENCOUNTER — Other Ambulatory Visit: Payer: Self-pay | Admitting: Urology

## 2018-11-20 ENCOUNTER — Encounter (HOSPITAL_COMMUNITY): Payer: Self-pay

## 2018-11-20 NOTE — Patient Instructions (Signed)
Shawn Russell  03-08-1961     Your procedure is scheduled on:  11-22-2018   Report to [facility] Main  Entrance, Report to admitting at  7:45 AM    Call this number if you have problems the morning of surgery [phone]       Remember: Do not eat food or drink liquids :After Midnight.                                      BRUSH YOUR TEETH MORNING OF SURGERY AND RINSE YOUR MOUTH OUT, NO CHEWING GUM CANDY OR MINTS.        Take these medicines the morning of surgery with A SIP OF WATER:   MS Contin,  Hydrocodone if needed,  Pantoprazole (protonix)                                   You may not have any metal on your body including hair pins and                piercings  Do not wear jewelry, make-up, lotions, powders or perfumes, deodorant                         Men may shave face and neck.      Do not bring valuables to the hospital. [place].  Contacts, dentures or bridgework may not be worn into surgery.  Leave suitcase in the car. After surgery it may be brought to your room.     Patients discharged the day of surgery will not be allowed to drive home. IF YOU ARE HAVING SURGERY AND GOING HOME THE SAME DAY, YOU MUST HAVE AN ADULT TO DRIVE YOU HOME AND BE WITH YOU FOR 24 HOURS. YOU MAY GO HOME BY TAXI OR UBER OR ORTHERWISE, BUT AN ADULT MUST ACCOMPANY YOU HOME AND STAY WITH YOU FOR 24 HOURS.    Name and phone number of your driver:    _______________________________________________________________             [facility] - Preparing for Surgery Before surgery, you can play an important role.  Because skin is not sterile, your skin needs to be as free of germs as possible.  You can reduce the number of germs on your skin by washing with CHG (chlorahexidine gluconate) soap before surgery.  CHG is an antiseptic cleaner which kills germs and bonds with the skin to continue killing  germs even after washing. Please DO NOT use if you have an allergy to CHG or antibacterial soaps.  If your skin becomes reddened/irritated stop using the CHG and inform your nurse when you arrive at Short Stay. Do not shave (including legs and underarms) for at least 48 hours prior to the first CHG shower.  You may shave your face/neck. Please follow these instructions carefully:  1.  Shower with CHG Soap the night before surgery and the  morning of Surgery.  2.  If you choose to wash your hair, wash your hair first as usual with your  normal  shampoo.  3.  After you  shampoo, rinse your hair and body thoroughly to remove the  shampoo.                            4.  Use CHG as you would any other liquid soap.  You can apply chg directly  to the skin and wash                       Gently with a scrungie or clean washcloth.  5.  Apply the CHG Soap to your body ONLY FROM THE NECK DOWN.   Do not use on face/ open                           Wound or open sores. Avoid contact with eyes, ears mouth and genitals (private parts).                       Wash face,  Genitals (private parts) with your normal soap.             6.  Wash thoroughly, paying special attention to the area where your surgery  will be performed.  7.  Thoroughly rinse your body with warm water from the neck down.  8.  DO NOT shower/wash with your normal soap after using and rinsing off  the CHG Soap.             9.  Pat yourself dry with a clean towel.            10.  Wear clean pajamas.            11.  Place clean sheets on your bed the night of your first shower and do not  sleep with pets. Day of Surgery : Do not apply any lotions/deodorants the morning of surgery.  Please wear clean clothes to the hospital/surgery center.  FAILURE TO FOLLOW THESE INSTRUCTIONS MAY RESULT IN THE CANCELLATION OF YOUR SURGERY PATIENT SIGNATURE_________________________________  NURSE  SIGNATURE__________________________________  ________________________________________________________________________

## 2018-11-21 ENCOUNTER — Other Ambulatory Visit: Payer: Self-pay

## 2018-11-21 ENCOUNTER — Encounter (HOSPITAL_COMMUNITY): Payer: Self-pay | Admitting: Physician Assistant

## 2018-11-21 ENCOUNTER — Encounter (HOSPITAL_COMMUNITY)
Admission: RE | Admit: 2018-11-21 | Discharge: 2018-11-21 | Disposition: A | Discharge status: Home / Self Care | Payer: PPO | Source: Ambulatory Visit | Attending: Urology | Admitting: Urology

## 2018-11-21 ENCOUNTER — Encounter (HOSPITAL_COMMUNITY): Payer: Self-pay

## 2018-11-21 DIAGNOSIS — Z01812 Encounter for preprocedural laboratory examination: Secondary | ICD-10-CM | POA: Insufficient documentation

## 2018-11-21 HISTORY — DX: Chronic obstructive pulmonary disease, unspecified: J44.9

## 2018-11-21 HISTORY — DX: Cyst of kidney, acquired: N28.1

## 2018-11-21 HISTORY — DX: Other specified postprocedural states: Z98.890

## 2018-11-21 HISTORY — DX: Personal history of urinary calculi: Z87.442

## 2018-11-21 HISTORY — DX: Unspecified hearing loss, bilateral: H91.93

## 2018-11-21 HISTORY — DX: Chronic viral hepatitis C: B18.2

## 2018-11-21 HISTORY — DX: Gastro-esophageal reflux disease without esophagitis: K21.9

## 2018-11-21 HISTORY — DX: Male erectile dysfunction, unspecified: N52.9

## 2018-11-21 HISTORY — DX: Unspecified osteoarthritis, unspecified site: M19.90

## 2018-11-21 HISTORY — DX: Personal history of other endocrine, nutritional and metabolic disease: Z86.39

## 2018-11-21 HISTORY — DX: Other constipation: K59.09

## 2018-11-21 HISTORY — DX: Personal history of transient ischemic attack (TIA), and cerebral infarction without residual deficits: Z86.73

## 2018-11-21 LAB — CBC
HCT: 44.9 % (ref 39.0–52.0)
Hemoglobin: 14.5 g/dL (ref 13.0–17.0)
MCH: 31.4 pg (ref 26.0–34.0)
MCHC: 32.3 g/dL (ref 30.0–36.0)
MCV: 97.2 fL (ref 80.0–100.0)
Platelets: 213 10*3/uL (ref 150–400)
RBC: 4.62 MIL/uL (ref 4.22–5.81)
RDW: 13.1 % (ref 11.5–15.5)
WBC: 6.9 10*3/uL (ref 4.0–10.5)
nRBC: 0 % (ref 0.0–0.2)

## 2018-11-21 LAB — COMPREHENSIVE METABOLIC PANEL
ALK PHOS: 58 U/L (ref 38–126)
ALT: 20 U/L (ref 0–44)
AST: 23 U/L (ref 15–41)
Albumin: 3.9 g/dL (ref 3.5–5.0)
Anion gap: 7 (ref 5–15)
BUN: 25 mg/dL — AB (ref 6–20)
CO2: 23 mmol/L (ref 22–32)
Calcium: 8.7 mg/dL — ABNORMAL LOW (ref 8.9–10.3)
Chloride: 108 mmol/L (ref 98–111)
Creatinine, Ser: 0.83 mg/dL (ref 0.61–1.24)
GFR calc Af Amer: >60 mL/min (ref >60)
GFR calc non Af Amer: >60 mL/min (ref >60)
Glucose, Bld: 89 mg/dL (ref 70–99)
Potassium: 4.2 mmol/L (ref 3.5–5.1)
Sodium: 138 mmol/L (ref 135–145)
Total Bilirubin: 0.4 mg/dL (ref 0.3–1.2)
Total Protein: 7.1 g/dL (ref 6.5–8.1)

## 2018-11-21 LAB — PROTIME-INR
INR: 1.02
Prothrombin Time: 13.3 seconds (ref 11.4–15.2)

## 2018-11-21 NOTE — Anesthesia Preprocedure Evaluation (Deleted)
Anesthesia Evaluation    Airway        Dental   Pulmonary Current Smoker,           Cardiovascular      Neuro/Psych    GI/Hepatic   Endo/Other    Renal/GU      Musculoskeletal   Abdominal   Peds  Hematology   Anesthesia Other Findings   Reproductive/Obstetrics                             Anesthesia Physical Anesthesia Plan  ASA:   Anesthesia Plan:    Post-op Pain Management:    Induction:   PONV Risk Score and Plan:   Airway Management Planned:   Additional Equipment:   Intra-op Plan:   Post-operative Plan:   Informed Consent:   Plan Discussed with:   Anesthesia Plan Comments: (See PST note 11/21/18, Konrad Felix, PA-C)        Anesthesia Quick Evaluation

## 2018-11-21 NOTE — Progress Notes (Signed)
Anesthesia Chart Review   Case:  [id number] Date/Time:  11/22/18 0930   Procedure:  SPERMATOCELECTOMY (Right )   Anesthesia type:  General   Pre-op diagnosis:  RIGHT SPERMATOCELE   Location:  [place] 09 / [facility]   Surgeon:  Irine Seal, MD      DISCUSSION: 58 yo current every day smoker (40 pack years) with h/o depression, COPD, Hepatitis C (treated 2010), GERD, CVA (per pt 2004), chronic pain syndrome (in pain management on Fentanyl and Dilaudid), right spermatocele scheduled for above surgery on 11/22/2018 with Dr. Irine Seal.   Pt can proceed with planned procedure barring acute status change.  VS: BP (!) 112/59 (BP Location: Right Arm)   Pulse 76   Temp 36.6 C (Oral)   Resp 18   Ht 5\' 11"  (1.803 m)   Wt 86.6 kg   SpO2 100%   BMI 26.64 kg/m   PROVIDERS: Dettinger, Fransisca Kaufmann, MD is PCP last seen 12/29/17   LABS: Labs reviewed: Acceptable for surgery. (all labs ordered are listed, but only abnormal results are displayed)  Labs Reviewed  CBC  PROTIME-INR  COMPREHENSIVE METABOLIC PANEL     IMAGES:   EKG: 01/23/2014 Rate 54 bpm Sinus bradycardia with sinus arrythmia Nonspecific ST and T wave abnormality Abnormal ECG  CV:  Past Medical History:  Diagnosis Date  . Chronic back pain   . Chronic constipation   . Chronic hepatitis C without hepatic coma ([place])    blood transfusion 1988;   per pt was treated with mediation approx. 2010 was followed by liver clinic @ [facility] and has been cured  . COPD (chronic obstructive pulmonary disease) ([place])   . Degenerative disc disease, lumbar   . Depression   . ED (erectile dysfunction)   . GERD (gastroesophageal reflux disease)   . Hearing loss of both ears    per pt has hearing aids but does not wear  . History of esophageal dilatation 2014  . History of hypogonadism   . History of kidney stones   . History of TIAs    per pt approx. 2004  . OA (osteoarthritis)    knees  . Renal cyst, left   . Spinal stenosis      Past Surgical History:  Procedure Laterality Date  . ANKLE SURGERY Right 1990   ORIF right ankle fracture;  retained hardware  . COLONOSCOPY WITH PROPOFOL N/A 02/14/2013   Procedure: COLONOSCOPY WITH PROPOFOL;  Surgeon: Daneil Dolin, MD;  Location: [facility];  Service: Endoscopy;  Laterality: N/A;  At cecum:09:16,  cecal withdrawal time:0930, total cecal time:14 minutes  . ESOPHAGOGASTRODUODENOSCOPY (EGD) WITH PROPOFOL N/A 02/14/2013   Procedure: ESOPHAGOGASTRODUODENOSCOPY (EGD) WITH PROPOFOL;  Surgeon: Daneil Dolin, MD;  Location: [facility];  Service: Endoscopy;  Laterality: N/A;  . kidney stone extraction  1989   ureteroscopy stone extraction  . KNEE SURGERY Left 1995   Reconstruction w/ retained hardware  . MALONEY DILATION N/A 02/14/2013   Procedure: Venia Minks DILATION;  Surgeon: Daneil Dolin, MD;  Location: [facility];  Service: Endoscopy;  Laterality: N/A;  . POSTERIOR LUMBAR FUSION  10-04-2004   dr Lenna Sciara. Arnoldo Morale @[facility]    laminectomy and fusion L3 -- L5  . POSTERIOR LUMBAR FUSION  06-26-2006   dr Lenna Sciara. Arnoldo Morale  @[facility]    Re-Do laminectomy L3,  fusion L2 -- L4  . ROTATOR CUFF REPAIR Right 2000  . surgery on ears Bilateral x3   as child    MEDICATIONS: . bisacodyl (DULCOLAX) 5  MG EC tablet  . aspirin 325 MG tablet  . HYDROmorphone (DILAUDID) 4 MG tablet  . morphine (MS CONTIN) 60 MG 12 hr tablet  . pantoprazole (PROTONIX) 40 MG tablet  . traZODone (DESYREL) 50 MG tablet   No current facility-administered medications for this encounter.      Maia Plan [facility] Pre-Surgical Testing [phone] 11/21/18 10:04 AM

## 2018-11-21 NOTE — H&P (Signed)
CC: I have a decreased testosterone level.  HPI: Shawn Russell is a 58 year-old male established patient who is here for a decreased testosterone level.  His symptoms have gotten worse over the last year. He does become fatigued easily. He has not gained weight.   His sex drive has decreased. He does have problems with erections.   11/07/18: Shawn Russell returns today in f/u. He remains off of TRT and his T was 401 off of therapy in 7/19. His PSA was 0.16. He has had further increase in the size of the right spermatocele which had shrunk on therapy. This is worse after sex. His energy level and libido improved but his ejaculate volume has declined. He has been able to stop the sildenafil with improved erections. He is voiding ok but his IPSS is 7. He can have a reduced stream. He has no nocturia. He is on dilaudid and was just switched to MS Contin from the fentanyl patch.    He has microhematuria with a negative w/u in 2/18. Denies gross hematuria.     CC: AUA Questions Scoring.  HPI:     AUA Symptom Score: Less than 20% of the time he has the sensation of not emptying his bladder completely when finished urinating. Less than 50% of the time he has to urinate again fewer than two hours after he has finished urinating. Less than 20% of the time he has to start and stop again several times when he urinates. Less than 20% of the time he finds it difficult to postpone urination. Less than 50% of the time he has a weak urinary stream. He never has to push or strain to begin urination. He never has to get up to urinate from the time he goes to bed until the time he gets up in the morning.   Calculated AUA Symptom Score: 7    ALLERGIES: None   MEDICATIONS: Aspirin 325 mg tablet  B Complex-Folic Acid  Dilaudid 4 mg tablet  Lexapro 20 mg tablet  Ms Contin  Multivitamin  Protonix 20 mg tablet, delayed release  Sildenafil Citrate 20 mg tablet 1-5 tablets as needed  Valium 5 mg tablet  Vitamin D3      GU PSH: Cystoscopy - 12/21/2016 Cystoscopy And Treatment - 2009 Locm 300-399Mg /Ml Iodine,1Ml - 11/28/2016      PSH Notes: Shoulder Surgery, Back Surgery, Cystoscopy With Manipulation Of Ureteral Calculus, Spine Repair, Rotator Cuff Repair, Ankle Surgery, Knee Surgery, Kidney Surgery   NON-GU PSH: Anesth, Ear Surgery Back Surgery (Unspecified) Leg/ankle Surgery Procedure Shoulder Surgery (Unspecified), Right    GU PMH: Flank Pain, Left, The pain has resolved and he has stable minimal hematuria. I am going to have him keep his f/u appt in October for hypogonadism f/u, but if the pain recurs, he will need repeat imaging. - 07/05/2017 Primary hypogonadism - 05/03/2017, - 01/18/2017, His T was low so I will repeat a T panel today with prolactin and FSH/LH and bring him in to discuss treatment options if needed. , - 12/21/2016 Microscopic hematuria, His CT only showed the collapsed left renal cyst and cystoscopy is negative. I will have him return in 6 months with a UA. - 12/21/2016, He has 3-10 RBC's today., - 2018 Renal cyst, The left renal cyst has collapsed and has felt to be benign. - 12/21/2016 Hydrocele (Worsening), Right, He has had further enlargement in the spermatocele but it is still only about 3cm soft and nontender. - 2018 ED due to arterial insufficiency,  Erectile dysfunction due to arterial insufficiency - 2014 History of urolithiasis, Nephrolithiasis - 2014 Spermatocele of epididymis, Unspec, Spermatocele - 2014      PMH Notes:  2010-10-29 11:44:25 - Note: Chronic Liver Disease  2007-11-12 09:53:40 - Note: Herniated Disc (L1 - L2)  hepatitis    NON-GU PMH: Hypogonadotropic hypogonadism (Worsening), His symptoms have worsened. I will recheck labs and resume TRT if his T is low. I will recheck labs in 1 month and have him return in 6 months with labs. - 05/09/2018 Anxiety, Anxiety (Symptom) - 2014 Decreased libido, Decreased libido - 2014 Personal history of other infectious and  parasitic diseases, History of hepatitis - 2014 Personal history of other mental and behavioral disorders, History of depression - 2014 Arthritis Depression Encounter for general adult medical examination without abnormal findings, Encounter for preventive health examination    FAMILY HISTORY: Death In The Family Father - Runs In Family Family Health Status - Mother's Age - Runs In Family Family Health Status Number - Runs In Family Heart Disease - Runs In Family Hypertension - Mother Intentional Injury By Another Person By Murder - Father Osteoporosis - Mother renal failure - Grandmother Rheumatoid Arthritis RF Positive - Mother   SOCIAL HISTORY: Marital Status: Single Preferred Language: English; Race: White Current Smoking Status: Patient smokes.   Tobacco Use Assessment Completed: Used Tobacco in last 30 days? Drinks 4+ caffeinated drinks per day.     Notes: Caffeine Use, Marital History - Currently Married, Occupation:, Alcohol Use, Tobacco Use   REVIEW OF SYSTEMS:    GU Review Male:   Patient denies frequent urination, hard to postpone urination, burning/ pain with urination, get up at night to urinate, leakage of urine, stream starts and stops, trouble starting your stream, have to strain to urinate , erection problems, and penile pain.  Gastrointestinal (Upper):   Patient denies indigestion/ heartburn, nausea, and vomiting.  Gastrointestinal (Lower):   Patient denies diarrhea and constipation.  Constitutional:   Patient denies fever, night sweats, weight loss, and fatigue.  Skin:   Patient denies skin rash/ lesion and itching.  Eyes:   Patient denies blurred vision and double vision.  Ears/ Nose/ Throat:   Patient denies sore throat and sinus problems.  Hematologic/Lymphatic:   Patient denies swollen glands and easy bruising.  Cardiovascular:   Patient denies leg swelling and chest pains.  Respiratory:   Patient denies cough and shortness of breath.  Endocrine:   Patient  denies excessive thirst.  Musculoskeletal:   Patient denies back pain and joint pain.  Neurological:   Patient denies headaches and dizziness.  Psychologic:   Patient denies depression and anxiety.   VITAL SIGNS:      11/07/2018 09:50 AM  Weight 220 lb / 99.79 kg  Height 71 in / 180.34 cm  BP 114/77 mmHg  Pulse 75 /min  Temperature 98.3 F / 36.8 C  BMI 30.7 kg/m   GU PHYSICAL EXAMINATION:    Scrotum: No lesions. No edema. No cysts. No warts.  Epididymides: Right: right body contains a spermatocele which is about 4cm and is medial to the testicle. Right: no masses, no cysts, no tenderness, no induration, no enlargement. Left: No spermatocele, no masses, no cysts, no tenderness, no induration, no enlargement.    MULTI-SYSTEM PHYSICAL EXAMINATION:    Constitutional: Well-nourished. No physical deformities. Normally developed. Good grooming.   Respiratory: Normal breath sounds. No labored breathing, no use of accessory muscles.   Cardiovascular: Regular rate and rhythm. No murmur, no gallop.  PAST DATA REVIEWED:  Source Of History:  Patient  Records Review:   AUA Symptom Score  Urine Test Review:   Urinalysis   05/09/18 04/26/17 11/07/16  PSA  Total PSA 0.16 ng/mL 0.26 ng/mL 0.10 ng/dl    04/26/17 12/21/16 11/07/16 11/13/07  Hormones  Testosterone, Total 1500.0 ng/dL 222.0 pg/dL 182.7 pg/dL 6.88     PROCEDURES:         Scrotal Ultrasound - 82423  Right Testicle: Length: 4.7 cm  Height: 2.4 cm  Width: 3.2 cm  Left Testicle: Length: 4.4 cm  Height: 2.3 cm  Width: 2.9 cm  Left Testis/Epididymis:  2.1 cm x 1.2 cm x 2.2 cm epi cyst noted. ------ 1.5 cm x 0.3 cm area of fluid in the tunica albuguinea noted.   Right Testis/Epididymis:  Multiple cystic areas noted in the epi. The largest measures 6.3 cm x 3.6 cm x 4.7 cm in the area of the body and tail and extends medial to the right testicle. ---- 0.4 cm x 0.2 cm area of fluid in the tunica albuguinea. ----- Varicocele  noted.                Urinalysis w/Scope Dipstick Dipstick Cont'd Micro  Color: Yellow Bilirubin: Neg mg/dL WBC/hpf: NS (Not Seen)  Appearance: Clear Ketones: Neg mg/dL RBC/hpf: 3 - 10/hpf  Specific Gravity: 1.025 Blood: 1+ ery/uL Bacteria: NS (Not Seen)  pH: 5.5 Protein: Neg mg/dL Cystals: NS (Not Seen)  Glucose: Neg mg/dL Urobilinogen: 0.2 mg/dL Casts: NS (Not Seen)    Nitrites: Neg Trichomonas: Not Present    Leukocyte Esterase: Neg leu/uL Mucous: Not Present      Epithelial Cells: NS (Not Seen)      Yeast: NS (Not Seen)      Sperm: Not Present    ASSESSMENT:      ICD-10 Details  1 GU:   Spermatocele of epididymis, Unspec - N43.40 Worsening - His right spermatocele and worsened and he is interested in treatment. He has a small left spermatocele but it is not symptomatic and I would prefer monitor that lesion. I have reviewed the risks of the spermatocelectomy including bleeding, infection, testicular injury or atrophy, recurrence, chronic pain, thrombotic events and anesthetic complications.   2   Microscopic hematuria - R31.21 Stable - He stable hematuria.  3   Primary hypogonadism - E29.1 He has no symptoms of hypogonadism and with his last testosterone being normal, a repeat is not needed.   4   ED due to arterial insufficiency - N52.01 Improving - He has been able to get off of sildenafil.   PLAN:            Medications Stop Meds: Fentanyl 50 mcg/hour patch, transdermal 72 hours  Discontinue: 11/07/2018  - Reason: The medication cycle was completed.  Testosterone Cypionate 200 mg/ml vial 0.35 ml IM Q1WK  Start: 05/03/2017  Discontinue: 11/07/2018  - Reason: The medication cycle was completed.            Orders X-Rays: Scrotal Ultrasound          Schedule Return Visit/Planned Activity: Next Available Appointment - Schedule Surgery          Document Letter(s):  Created for Patient: Clinical Summary

## 2018-11-21 NOTE — Progress Notes (Signed)
Chart given to anesthesia for review, Konrad Felix PA.

## 2018-11-22 ENCOUNTER — Ambulatory Visit (INDEPENDENT_AMBULATORY_CARE_PROVIDER_SITE_OTHER): Payer: PPO | Admitting: Family Medicine

## 2018-11-22 ENCOUNTER — Ambulatory Visit (HOSPITAL_COMMUNITY): Admission: RE | Admit: 2018-11-22 | Payer: PPO | Source: Home / Self Care | Admitting: Urology

## 2018-11-22 ENCOUNTER — Ambulatory Visit: Payer: PPO | Admitting: Family Medicine

## 2018-11-22 ENCOUNTER — Encounter (HOSPITAL_COMMUNITY): Admission: RE | Payer: Self-pay | Source: Home / Self Care

## 2018-11-22 DIAGNOSIS — R6889 Other general symptoms and signs: Secondary | ICD-10-CM

## 2018-11-22 SURGERY — EXCISION, SPERMATOCELE
Anesthesia: General | Laterality: Right

## 2018-11-22 MED ORDER — OSELTAMIVIR PHOSPHATE 75 MG PO CAPS: 75.0000 mg | ORAL_CAPSULE | Freq: Two times a day (BID) | ORAL | 0 refills | Status: AC

## 2018-11-22 NOTE — Progress Notes (Signed)
S: Called this morning for tamiflu and was told he had to come in to be checked.  Patient notes that his wife was diagnosed with flu yesterday.  He is here for Tamiflu rx.  He started have some mild flulike symptoms today.  O: General: Nontoxic appearing.  Sclera white.  Well-nourished.  No acute distress. Pulm: Normal work of breathing on room air.  No wheezes.  AP:  Flulike illness -Likely has influenza A.  Tamiflu sent to pharmacy 75mg  po bid. I reviewed possible side effects of the medication.  Continue supportive care in addition to Tamiflu.  Follow-up PRN.  Ashly M. Lajuana Ripple, [facility]

## 2018-11-30 DIAGNOSIS — Z79891 Long term (current) use of opiate analgesic: Secondary | ICD-10-CM | POA: Diagnosis not present

## 2018-11-30 DIAGNOSIS — M47812 Spondylosis without myelopathy or radiculopathy, cervical region: Secondary | ICD-10-CM | POA: Diagnosis not present

## 2018-11-30 DIAGNOSIS — G894 Chronic pain syndrome: Secondary | ICD-10-CM | POA: Diagnosis not present

## 2018-11-30 DIAGNOSIS — M961 Postlaminectomy syndrome, not elsewhere classified: Secondary | ICD-10-CM | POA: Diagnosis not present

## 2018-12-04 ENCOUNTER — Encounter: Payer: Self-pay | Admitting: Internal Medicine

## 2018-12-04 ENCOUNTER — Ambulatory Visit: Payer: Medicare Other | Admitting: Internal Medicine

## 2018-12-04 VITALS — BP 135/79 | HR 72 | Temp 97.0°F | Ht 71.0 in | Wt 197.8 lb

## 2018-12-04 DIAGNOSIS — G8929 Other chronic pain: Secondary | ICD-10-CM

## 2018-12-04 DIAGNOSIS — R1031 Right lower quadrant pain: Secondary | ICD-10-CM

## 2018-12-04 NOTE — Patient Instructions (Signed)
Trial of Movantik 12.5 mg once daily (disp 30 with 3 refills)  Stop Ducolax  Call and let me know how you are doing with BM's and abdominal pain in 2 weeks  Permanently discontinue Protonix  Office visit in 3 months  You may need further evaluation  Depending on your symptoms reported in 2 weeks

## 2018-12-04 NOTE — Progress Notes (Signed)
Primary Care Physician:  Dettinger, Fransisca Kaufmann, MD Primary Gastroenterologist:  Dr.   Pre-Procedure History & Physical: HPI:  Shawn Russell. is a 58 y.o. male here for evaluation of vague right-sided abdominal pain -  intermittent.  He thinks that eating a meal sometimes improves the pain but this is not a consistent observation.  Chronically constipated on chronic opioid therapy.  Maybe 1-2 bowel movements weekly.  Takes Dulcolax daily but no other laxatives.  No rectal bleeding.  EGD and colonoscopy 6 years ago with findings as outlined below. Patient denies nausea or vomiting.  Denies GERD or dysphagia.  Previously treated with Protonix 40 mg empirically.  He felt this made his pain worse.  He stopped taking it. History of HCV eradicated with interferon and ribavirin many years ago.  Appears to have preserved synthetic function and no advanced liver disease.  He is followed closely by [facility] for this problem. Past Medical History:  Diagnosis Date  . Chronic back pain   . Chronic constipation   . Chronic hepatitis C without hepatic coma ([place])    blood transfusion 1988;   per pt was treated with mediation approx. 2010 was followed by liver clinic @ [facility] and has been cured  . COPD (chronic obstructive pulmonary disease) ([place])   . Degenerative disc disease, lumbar   . Depression   . ED (erectile dysfunction)   . GERD (gastroesophageal reflux disease)   . Hearing loss of both ears    per pt has hearing aids but does not wear  . History of esophageal dilatation 2014  . History of hypogonadism   . History of kidney stones   . History of TIAs    per pt approx. 2004  . OA (osteoarthritis)    knees  . Renal cyst, left   . Spinal stenosis     Past Surgical History:  Procedure Laterality Date  . ANKLE SURGERY Right 1990   ORIF right ankle fracture;  retained hardware  . COLONOSCOPY WITH PROPOFOL N/A 02/14/2013   Procedure: COLONOSCOPY WITH PROPOFOL;  Surgeon: Daneil Dolin, MD;   Location: [facility];  Service: Endoscopy;  Laterality: N/A;  At cecum:09:16,  cecal withdrawal time:0930, total cecal time:14 minutes  . ESOPHAGOGASTRODUODENOSCOPY (EGD) WITH PROPOFOL N/A 02/14/2013   Procedure: ESOPHAGOGASTRODUODENOSCOPY (EGD) WITH PROPOFOL;  Surgeon: Daneil Dolin, MD;  Location: [facility];  Service: Endoscopy;  Laterality: N/A;  . kidney stone extraction  1989   ureteroscopy stone extraction  . KNEE SURGERY Left 1995   Reconstruction w/ retained hardware  . MALONEY DILATION N/A 02/14/2013   Procedure: Venia Minks DILATION;  Surgeon: Daneil Dolin, MD;  Location: [facility];  Service: Endoscopy;  Laterality: N/A;  . POSTERIOR LUMBAR FUSION  10-04-2004   dr Lenna Sciara. Arnoldo Morale @[facility]    laminectomy and fusion L3 -- L5  . POSTERIOR LUMBAR FUSION  06-26-2006   dr Lenna Sciara. Arnoldo Morale  @[facility]    Re-Do laminectomy L3,  fusion L2 -- L4  . ROTATOR CUFF REPAIR Right 2000  . surgery on ears Bilateral x3   as child    Prior to Admission medications   Medication Sig Start Date End Date Taking? Authorizing Provider  bisacodyl (DULCOLAX) 5 MG EC tablet Take 5 mg by mouth daily as needed for moderate constipation.   Yes [provider]  fentaNYL (DURAGESIC) 50 MCG/HR Place 1 patch onto the skin every other day. 11/30/18  Yes [provider]  HYDROmorphone (DILAUDID) 4 MG tablet Take 4 mg by  mouth every 4 (four) hours.    Yes [provider]  aspirin 325 MG tablet Take 325 mg by mouth daily.    [provider]  morphine (MS CONTIN) 60 MG 12 hr tablet Take 60 mg by mouth 2 (two) times daily. 11/05/18   [provider]  pantoprazole (PROTONIX) 40 MG tablet TAKE 1 TABLET ONCE A DAY Patient not taking: Reported on 11/15/2018 03/06/18   Dettinger, Fransisca Kaufmann, MD  traZODone (DESYREL) 50 MG tablet TAKE 1/2 TO 1 TABLET AT BEDTIME AS NEEDED FOR SLEEP Patient not taking: Reported on 12/04/2018 03/21/18   Dettinger, Fransisca Kaufmann, MD    Allergies as of 12/04/2018  . (No Known Allergies)    Family  History  Problem Relation Age of Onset  . Heart disease Mother   . Osteoarthritis Mother   . Heart disease Father   . Colon cancer Neg Hx   . Liver disease Neg Hx     Social History   Socioeconomic History  . Marital status: Married    Spouse name: Not on file  . Number of children: Not on file  . Years of education: Not on file  . Highest education level: Not on file  Occupational History  . Not on file  Social Needs  . Financial resource strain: Not on file  . Food insecurity:    Worry: Not on file    Inability: Not on file  . Transportation needs:    Medical: Not on file    Non-medical: Not on file  Tobacco Use  . Smoking status: Current Every Day Smoker    Packs/day: 1.00    Years: 40.00    Pack years: 40.00    Types: Cigarettes  . Smokeless tobacco: Never Used  Substance and Sexual Activity  . Alcohol use: No  . Drug use: No  . Sexual activity: Not on file  Lifestyle  . Physical activity:    Days per week: Not on file    Minutes per session: Not on file  . Stress: Not on file  Relationships  . Social connections:    Talks on phone: Not on file    Gets together: Not on file    Attends religious service: Not on file    Active member of club or organization: Not on file    Attends meetings of clubs or organizations: Not on file    Relationship status: Not on file  . Intimate partner violence:    Fear of current or ex partner: Not on file    Emotionally abused: Not on file    Physically abused: Not on file    Forced sexual activity: Not on file  Other Topics Concern  . Not on file  Social History Narrative  . Not on file    Review of Systems: See HPI, otherwise negative ROS  Physical Exam: BP 135/79   Pulse 72   Temp (!) 97 F (36.1 C) (Oral)   Ht 5\' 11"  (1.803 m)   Wt 197 lb 12.8 oz (89.7 kg)   BMI 27.59 kg/m  General:   Alert,   pleasant and cooperative in NAD Mouth:  No deformity or lesions. Neck:  Supple; no masses or thyromegaly. No  significant cervical adenopathy. Lungs:  Clear throughout to auscultation.   No wheezes, crackles, or rhonchi. No acute distress. Heart:  Regular rate and rhythm; no murmurs, clicks, rubs,  or gallops. Abdomen: Non-distended, normal bowel sounds.  Soft and nontender without appreciable mass or  hepatosplenomegaly.  Pulses:  Normal pulses noted. Extremities:  Without clubbing or edema. Rectal: Good sphincter tone.  Scant brown stool in the rectal vault is Hemoccult negative.   Impression/Plan: Pleasant 58 year old gentleman with chronic opioid dependence presents with vague right-sided abdominal pain waxes and wanes.  Fairly nonspecific.  Significantly constipated-also, likely opioid induced.  I do not really detect any alarm symptoms at this time.  He is not anemic.  He is Hemoccult negative. I feel the first order of business is get his bowels moving better and then see we stand with his abdominal symptoms.   Recommendations:  Trial of Movantik 12.5 mg once daily (disp 30 with 3 refills)  Stop Ducolax  Call and let me know how you are doing with BM's and abdominal pain in 2 weeks  Permanently discontinue Protonix  Office visit in 3 months  Patient may need further evaluation depending on symptoms reported over the next several weeks.  `          Notice: This dictation was prepared with Dragon dictation along with smaller phrase technology. Any transcriptional errors that result from this process are unintentional and may not be corrected upon review.

## 2018-12-05 ENCOUNTER — Other Ambulatory Visit: Payer: Self-pay

## 2018-12-05 ENCOUNTER — Encounter: Payer: Self-pay | Admitting: Internal Medicine

## 2018-12-05 ENCOUNTER — Telehealth: Payer: Self-pay | Admitting: Internal Medicine

## 2018-12-05 MED ORDER — NALOXEGOL OXALATE 12.5 MG PO TABS: 12.5000 mg | ORAL_TABLET | Freq: Every day | ORAL | 3 refills | Status: DC

## 2018-12-05 NOTE — Telephone Encounter (Signed)
Pt notified that his RX was sent.

## 2018-12-05 NOTE — Telephone Encounter (Signed)
PATIENT WAS HERE YESTERDAY AND CALLED TO LET us KNOW HIS PHARMACY HAS NOT GOTTEN HIS PRESCRIPTION FROM HERE YET.

## 2018-12-10 ENCOUNTER — Telehealth: Payer: Self-pay | Admitting: Family Medicine

## 2018-12-11 ENCOUNTER — Other Ambulatory Visit: Payer: Self-pay | Admitting: Urology

## 2018-12-11 NOTE — Progress Notes (Signed)
Please place orders in epic . Pt. Has a preop on 12-12-18 @ 0800 am Thank you!

## 2018-12-11 NOTE — Patient Instructions (Addendum)
Shawn Russell.  12/11/2018   Your procedure is scheduled on: 12-13-18   Report to [facility] Main  Entrance            Report to admitting at       [place] AM    Call this number if you have problems the morning of surgery [phone]    Remember: Do not eat food or drink liquids :After Midnight.  BRUSH YOUR TEETH MORNING OF SURGERY AND RINSE YOUR MOUTH OUT, NO CHEWING GUM CANDY OR MINTS.     Take these medicines the morning of surgery with A SIP OF WATER: MS Contin                                You may not have any metal on your body including hair pins and              piercings  Do not wear jewelry,lotions, powders or perfumes, deodorant                         Men may shave face and neck.   Do not bring valuables to the hospital. [place].  Contacts, dentures or bridgework may not be worn into surgery.      Patients discharged the day of surgery will not be allowed to drive home. IF YOU ARE HAVING SURGERY AND GOING HOME THE SAME DAY, YOU MUST HAVE AN ADULT TO DRIVE YOU HOME AND BE WITH YOU FOR 24 HOURS. YOU MAY GO HOME BY TAXI OR UBER OR ORTHERWISE, BUT AN ADULT MUST ACCOMPANY YOU HOME AND STAY WITH YOU FOR 24 HOURS.  Name and phone number of your driver:  Special Instructions: N/A              Please read over the following fact sheets you were given: _____________________________________________________________________             [facility] - Preparing for Surgery Before surgery, you can play an important role.  Because skin is not sterile, your skin needs to be as free of germs as possible.  You can reduce the number of germs on your skin by washing with CHG (chlorahexidine gluconate) soap before surgery.  CHG is an antiseptic cleaner which kills germs and bonds with the skin to continue killing germs even after washing. Please DO NOT use if you have an allergy to CHG or antibacterial  soaps.  If your skin becomes reddened/irritated stop using the CHG and inform your nurse when you arrive at Short Stay. Do not shave (including legs and underarms) for at least 48 hours prior to the first CHG shower.  You may shave your face/neck. Please follow these instructions carefully:  1.  Shower with CHG Soap the night before surgery and the  morning of Surgery.  2.  If you choose to wash your hair, wash your hair first as usual with your  normal  shampoo.  3.  After you shampoo, rinse your hair and body thoroughly to remove the  shampoo.                           4.  Use CHG as you would any other liquid soap.  You can apply chg directly  to the skin and wash                       Gently with a scrungie or clean washcloth.  5.  Apply the CHG Soap to your body ONLY FROM THE NECK DOWN.   Do not use on face/ open                           Wound or open sores. Avoid contact with eyes, ears mouth and genitals (private parts).                       Wash face,  Genitals (private parts) with your normal soap.             6.  Wash thoroughly, paying special attention to the area where your surgery  will be performed.  7.  Thoroughly rinse your body with warm water from the neck down.  8.  DO NOT shower/wash with your normal soap after using and rinsing off  the CHG Soap.                9.  Pat yourself dry with a clean towel.            10.  Wear clean pajamas.            11.  Place clean sheets on your bed the night of your first shower and do not  sleep with pets. Day of Surgery : Do not apply any lotions/deodorants the morning of surgery.  Please wear clean clothes to the hospital/surgery center.  FAILURE TO FOLLOW THESE INSTRUCTIONS MAY RESULT IN THE CANCELLATION OF YOUR SURGERY PATIENT SIGNATURE_________________________________  NURSE SIGNATURE__________________________________  ________________________________________________________________________

## 2018-12-11 NOTE — Telephone Encounter (Signed)
Pt scheduled for 02/07/19

## 2018-12-12 ENCOUNTER — Other Ambulatory Visit: Payer: Self-pay

## 2018-12-12 ENCOUNTER — Encounter (HOSPITAL_COMMUNITY): Payer: Self-pay

## 2018-12-12 ENCOUNTER — Encounter (HOSPITAL_COMMUNITY)
Admission: RE | Admit: 2018-12-12 | Discharge: 2018-12-12 | Disposition: A | Discharge status: Home / Self Care | Payer: PPO | Source: Ambulatory Visit | Attending: Urology | Admitting: Urology

## 2018-12-12 DIAGNOSIS — N4341 Spermatocele of epididymis, single: Secondary | ICD-10-CM | POA: Diagnosis not present

## 2018-12-12 DIAGNOSIS — Z8249 Family history of ischemic heart disease and other diseases of the circulatory system: Secondary | ICD-10-CM | POA: Diagnosis not present

## 2018-12-12 DIAGNOSIS — E291 Testicular hypofunction: Secondary | ICD-10-CM | POA: Diagnosis not present

## 2018-12-12 DIAGNOSIS — Z841 Family history of disorders of kidney and ureter: Secondary | ICD-10-CM | POA: Diagnosis not present

## 2018-12-12 DIAGNOSIS — K219 Gastro-esophageal reflux disease without esophagitis: Secondary | ICD-10-CM | POA: Diagnosis not present

## 2018-12-12 DIAGNOSIS — Z8262 Family history of osteoporosis: Secondary | ICD-10-CM | POA: Diagnosis not present

## 2018-12-12 DIAGNOSIS — Z01818 Encounter for other preprocedural examination: Secondary | ICD-10-CM | POA: Insufficient documentation

## 2018-12-12 DIAGNOSIS — Z7982 Long term (current) use of aspirin: Secondary | ICD-10-CM | POA: Diagnosis not present

## 2018-12-12 DIAGNOSIS — I771 Stricture of artery: Secondary | ICD-10-CM | POA: Diagnosis not present

## 2018-12-12 DIAGNOSIS — F329 Major depressive disorder, single episode, unspecified: Secondary | ICD-10-CM | POA: Diagnosis not present

## 2018-12-12 DIAGNOSIS — M1990 Unspecified osteoarthritis, unspecified site: Secondary | ICD-10-CM | POA: Diagnosis not present

## 2018-12-12 DIAGNOSIS — F419 Anxiety disorder, unspecified: Secondary | ICD-10-CM | POA: Diagnosis not present

## 2018-12-12 DIAGNOSIS — F17200 Nicotine dependence, unspecified, uncomplicated: Secondary | ICD-10-CM | POA: Diagnosis not present

## 2018-12-12 DIAGNOSIS — Z8261 Family history of arthritis: Secondary | ICD-10-CM | POA: Diagnosis not present

## 2018-12-12 DIAGNOSIS — R3129 Other microscopic hematuria: Secondary | ICD-10-CM | POA: Diagnosis not present

## 2018-12-12 DIAGNOSIS — Z79899 Other long term (current) drug therapy: Secondary | ICD-10-CM | POA: Diagnosis not present

## 2018-12-12 LAB — COMPREHENSIVE METABOLIC PANEL
ALK PHOS: 56 U/L (ref 38–126)
ALT: 18 U/L (ref 0–44)
AST: 20 U/L (ref 15–41)
Albumin: 4 g/dL (ref 3.5–5.0)
Anion gap: 5 (ref 5–15)
BUN: 21 mg/dL — ABNORMAL HIGH (ref 6–20)
CO2: 28 mmol/L (ref 22–32)
Calcium: 8.8 mg/dL — ABNORMAL LOW (ref 8.9–10.3)
Chloride: 105 mmol/L (ref 98–111)
Creatinine, Ser: 0.85 mg/dL (ref 0.61–1.24)
GFR calc Af Amer: >60 mL/min (ref >60)
GFR calc non Af Amer: >60 mL/min (ref >60)
GLUCOSE: 94 mg/dL (ref 70–99)
Potassium: 4.1 mmol/L (ref 3.5–5.1)
SODIUM: 138 mmol/L (ref 135–145)
Total Bilirubin: 0.5 mg/dL (ref 0.3–1.2)
Total Protein: 7.3 g/dL (ref 6.5–8.1)

## 2018-12-12 LAB — CBC
HCT: 43.9 % (ref 39.0–52.0)
HEMOGLOBIN: 14.2 g/dL (ref 13.0–17.0)
MCH: 32 pg (ref 26.0–34.0)
MCHC: 32.3 g/dL (ref 30.0–36.0)
MCV: 98.9 fL (ref 80.0–100.0)
Platelets: 200 10*3/uL (ref 150–400)
RBC: 4.44 MIL/uL (ref 4.22–5.81)
RDW: 13.2 % (ref 11.5–15.5)
WBC: 4.9 10*3/uL (ref 4.0–10.5)
nRBC: 0 % (ref 0.0–0.2)

## 2018-12-12 NOTE — Progress Notes (Signed)
Anesthesia Chart Review   Case:  [id number] Date/Time:  12/13/18 1000   Procedure:  SPERMATOCELECTOMY (Right )   Anesthesia type:  General   Pre-op diagnosis:  RIGHT SPERMATOCELE   Location:  [facility] PROCEDURE ROOM / [facility]   Surgeon:  Irine Seal, MD      DISCUSSION:58 yo current every day smoker (40 pack years) with h/o depression, COPD, Hepatitis C (treated 2010), GERD, CVA (per pt 2004), chronic pain syndrome (in pain management on Fentanyl and Dilaudid), right spermatocele scheduled for above surgery on 11/22/2018 with Dr. Irine Seal.   Pt previously scheduled for procedure 11/21/2018, cancelled due to Flu diagnosis and prescribed Tamiflu by PCP 11/22/2018.  Pt is asymptomatic at PAT visit 12/12/18.   Pt can proceed with planned procedure barring acute status change.   VS: BP 138/73   Pulse (!) 56   Temp 36.8 C (Oral)   Resp 16   Ht 5\' 11"  (1.803 m)   Wt 90.7 kg   SpO2 98%   BMI 27.89 kg/m   PROVIDERS: Dettinger, Fransisca Kaufmann, MD   LABS: Labs reviewed: Acceptable for surgery. (all labs ordered are listed, but only abnormal results are displayed)  Labs Reviewed  COMPREHENSIVE METABOLIC PANEL - Abnormal; Notable for the following components:      Result Value   BUN 21 (*)    Calcium 8.8 (*)    All other components within normal limits  CBC     IMAGES:   EKG: 01/23/2014 Rate 54 bpm Sinus bradycardia with sinus arrhythmia  Nonspecific ST and T wave abnormality Abnormal ECG  CV:  Past Medical History:  Diagnosis Date  . Chronic back pain   . Chronic constipation   . Chronic hepatitis C without hepatic coma ([place])    blood transfusion 1988;   per pt was treated with mediation approx. 2010 was followed by liver clinic @ [facility] and has been cured  . COPD (chronic obstructive pulmonary disease) ([place])   . Degenerative disc disease, lumbar   . Depression   . ED (erectile dysfunction)   . GERD (gastroesophageal reflux disease)   . Hearing loss of both ears    per pt has  hearing aids but does not wear  . History of esophageal dilatation 2014  . History of hypogonadism   . History of kidney stones   . History of TIAs    per pt approx. 2004  . OA (osteoarthritis)    knees  . Renal cyst, left   . Spinal stenosis     Past Surgical History:  Procedure Laterality Date  . ANKLE SURGERY Right 1990   ORIF right ankle fracture;  retained hardware  . COLONOSCOPY WITH PROPOFOL N/A 02/14/2013   Procedure: COLONOSCOPY WITH PROPOFOL;  Surgeon: Daneil Dolin, MD;  Location: [facility];  Service: Endoscopy;  Laterality: N/A;  At cecum:09:16,  cecal withdrawal time:0930, total cecal time:14 minutes  . ESOPHAGOGASTRODUODENOSCOPY (EGD) WITH PROPOFOL N/A 02/14/2013   Procedure: ESOPHAGOGASTRODUODENOSCOPY (EGD) WITH PROPOFOL;  Surgeon: Daneil Dolin, MD;  Location: [facility];  Service: Endoscopy;  Laterality: N/A;  . kidney stone extraction  1989   ureteroscopy stone extraction  . KNEE SURGERY Left 1995   Reconstruction w/ retained hardware  . MALONEY DILATION N/A 02/14/2013   Procedure: Venia Minks DILATION;  Surgeon: Daneil Dolin, MD;  Location: [facility];  Service: Endoscopy;  Laterality: N/A;  . POSTERIOR LUMBAR FUSION  10-04-2004   dr Lenna Sciara. Arnoldo Morale @[facility]    laminectomy and fusion L3 --  L5  . POSTERIOR LUMBAR FUSION  06-26-2006   dr Lenna Sciara. Arnoldo Morale  @[facility]    Re-Do laminectomy L3,  fusion L2 -- L4  . ROTATOR CUFF REPAIR Right 2000  . surgery on ears Bilateral x3   as child    MEDICATIONS: . aspirin 325 MG tablet  . bisacodyl (DULCOLAX) 5 MG EC tablet  . fentaNYL (DURAGESIC) 50 MCG/HR  . HYDROmorphone (DILAUDID) 4 MG tablet  . morphine (MS CONTIN) 60 MG 12 hr tablet  . naloxegol oxalate (MOVANTIK) 12.5 MG TABS tablet  . pantoprazole (PROTONIX) 40 MG tablet  . traZODone (DESYREL) 50 MG tablet   No current facility-administered medications for this encounter.      Maia Plan [facility] Pre-Surgical Testing [phone] 12/12/18 10:59 AM

## 2018-12-12 NOTE — H&P (Signed)
CC: I have a decreased testosterone level.  HPI: Shawn Russell is a 58 year-old male established patient who is here for a decreased testosterone level.  His symptoms have gotten worse over the last year. He does become fatigued easily. He has not gained weight.   His sex drive has decreased. He does have problems with erections.   11/07/18: Linkyn returns today in f/u. He remains off of TRT and his T was 401 off of therapy in 7/19. His PSA was 0.16. He has had further increase in the size of the right spermatocele which had shrunk on therapy. This is worse after sex. His energy level and libido improved but his ejaculate volume has declined. He has been able to stop the sildenafil with improved erections. He is voiding ok but his IPSS is 7. He can have a reduced stream. He has no nocturia. He is on dilaudid and was just switched to MS Contin from the fentanyl patch.    He has microhematuria with a negative w/u in 2/18. Denies gross hematuria.     CC: AUA Questions Scoring.  HPI:     AUA Symptom Score: Less than 20% of the time he has the sensation of not emptying his bladder completely when finished urinating. Less than 50% of the time he has to urinate again fewer than two hours after he has finished urinating. Less than 20% of the time he has to start and stop again several times when he urinates. Less than 20% of the time he finds it difficult to postpone urination. Less than 50% of the time he has a weak urinary stream. He never has to push or strain to begin urination. He never has to get up to urinate from the time he goes to bed until the time he gets up in the morning.   Calculated AUA Symptom Score: 7    ALLERGIES: None   MEDICATIONS: Aspirin 325 mg tablet  B Complex-Folic Acid  Dilaudid 4 mg tablet  Lexapro 20 mg tablet  Ms Contin  Multivitamin  Protonix 20 mg tablet, delayed release  Sildenafil Citrate 20 mg tablet 1-5 tablets as needed  Valium 5 mg tablet  Vitamin D3      GU PSH: Cystoscopy - 12/21/2016 Cystoscopy And Treatment - 2009 Locm 300-399Mg /Ml Iodine,1Ml - 11/28/2016      PSH Notes: Shoulder Surgery, Back Surgery, Cystoscopy With Manipulation Of Ureteral Calculus, Spine Repair, Rotator Cuff Repair, Ankle Surgery, Knee Surgery, Kidney Surgery   NON-GU PSH: Anesth, Ear Surgery Back Surgery (Unspecified) Leg/ankle Surgery Procedure Shoulder Surgery (Unspecified), Right    GU PMH: Flank Pain, Left, The pain has resolved and he has stable minimal hematuria. I am going to have him keep his f/u appt in October for hypogonadism f/u, but if the pain recurs, he will need repeat imaging. - 07/05/2017 Primary hypogonadism - 05/03/2017, - 01/18/2017, His T was low so I will repeat a T panel today with prolactin and FSH/LH and bring him in to discuss treatment options if needed. , - 12/21/2016 Microscopic hematuria, His CT only showed the collapsed left renal cyst and cystoscopy is negative. I will have him return in 6 months with a UA. - 12/21/2016, He has 3-10 RBC's today., - 2018 Renal cyst, The left renal cyst has collapsed and has felt to be benign. - 12/21/2016 Hydrocele (Worsening), Right, He has had further enlargement in the spermatocele but it is still only about 3cm soft and nontender. - 2018 ED due to arterial insufficiency,  Erectile dysfunction due to arterial insufficiency - 2014 History of urolithiasis, Nephrolithiasis - 2014 Spermatocele of epididymis, Unspec, Spermatocele - 2014      PMH Notes:  2010-10-29 11:44:25 - Note: Chronic Liver Disease  2007-11-12 09:53:40 - Note: Herniated Disc (L1 - L2)  hepatitis    NON-GU PMH: Hypogonadotropic hypogonadism (Worsening), His symptoms have worsened. I will recheck labs and resume TRT if his T is low. I will recheck labs in 1 month and have him return in 6 months with labs. - 05/09/2018 Anxiety, Anxiety (Symptom) - 2014 Decreased libido, Decreased libido - 2014 Personal history of other infectious and  parasitic diseases, History of hepatitis - 2014 Personal history of other mental and behavioral disorders, History of depression - 2014 Arthritis Depression Encounter for general adult medical examination without abnormal findings, Encounter for preventive health examination    FAMILY HISTORY: Death In The Family Father - Runs In Family Family Health Status - Mother's Age - Runs In Family Family Health Status Number - Runs In Family Heart Disease - Runs In Family Hypertension - Mother Intentional Injury By Another Person By Murder - Father Osteoporosis - Mother renal failure - Grandmother Rheumatoid Arthritis RF Positive - Mother   SOCIAL HISTORY: Marital Status: Single Preferred Language: English; Race: White Current Smoking Status: Patient smokes.   Tobacco Use Assessment Completed: Used Tobacco in last 30 days? Drinks 4+ caffeinated drinks per day.     Notes: Caffeine Use, Marital History - Currently Married, Occupation:, Alcohol Use, Tobacco Use   REVIEW OF SYSTEMS:    GU Review Male:   Patient denies frequent urination, hard to postpone urination, burning/ pain with urination, get up at night to urinate, leakage of urine, stream starts and stops, trouble starting your stream, have to strain to urinate , erection problems, and penile pain.  Gastrointestinal (Upper):   Patient denies indigestion/ heartburn, nausea, and vomiting.  Gastrointestinal (Lower):   Patient denies diarrhea and constipation.  Constitutional:   Patient denies fever, night sweats, weight loss, and fatigue.  Skin:   Patient denies skin rash/ lesion and itching.  Eyes:   Patient denies blurred vision and double vision.  Ears/ Nose/ Throat:   Patient denies sore throat and sinus problems.  Hematologic/Lymphatic:   Patient denies swollen glands and easy bruising.  Cardiovascular:   Patient denies leg swelling and chest pains.  Respiratory:   Patient denies cough and shortness of breath.  Endocrine:   Patient  denies excessive thirst.  Musculoskeletal:   Patient denies back pain and joint pain.  Neurological:   Patient denies headaches and dizziness.  Psychologic:   Patient denies depression and anxiety.   VITAL SIGNS:      11/07/2018 09:50 AM  Weight 220 lb / 99.79 kg  Height 71 in / 180.34 cm  BP 114/77 mmHg  Pulse 75 /min  Temperature 98.3 F / 36.8 C  BMI 30.7 kg/m   GU PHYSICAL EXAMINATION:    Scrotum: No lesions. No edema. No cysts. No warts.  Epididymides: Right: right body contains a spermatocele which is about 4cm and is medial to the testicle. Right: no masses, no cysts, no tenderness, no induration, no enlargement. Left: No spermatocele, no masses, no cysts, no tenderness, no induration, no enlargement.    MULTI-SYSTEM PHYSICAL EXAMINATION:    Constitutional: Well-nourished. No physical deformities. Normally developed. Good grooming.   Respiratory: Normal breath sounds. No labored breathing, no use of accessory muscles.   Cardiovascular: Regular rate and rhythm. No murmur, no gallop.  PAST DATA REVIEWED:  Source Of History:  Patient  Records Review:   AUA Symptom Score  Urine Test Review:   Urinalysis   05/09/18 04/26/17 11/07/16  PSA  Total PSA 0.16 ng/mL 0.26 ng/mL 0.10 ng/dl    04/26/17 12/21/16 11/07/16 11/13/07  Hormones  Testosterone, Total 1500.0 ng/dL 222.0 pg/dL 182.7 pg/dL 6.88     PROCEDURES:         Scrotal Ultrasound - 62130  Right Testicle: Length: 4.7 cm  Height: 2.4 cm  Width: 3.2 cm  Left Testicle: Length: 4.4 cm  Height: 2.3 cm  Width: 2.9 cm  Left Testis/Epididymis:  2.1 cm x 1.2 cm x 2.2 cm epi cyst noted. ------ 1.5 cm x 0.3 cm area of fluid in the tunica albuguinea noted.   Right Testis/Epididymis:  Multiple cystic areas noted in the epi. The largest measures 6.3 cm x 3.6 cm x 4.7 cm in the area of the body and tail and extends medial to the right testicle. ---- 0.4 cm x 0.2 cm area of fluid in the tunica albuguinea. ----- Varicocele  noted.                Urinalysis w/Scope Dipstick Dipstick Cont'd Micro  Color: Yellow Bilirubin: Neg mg/dL WBC/hpf: NS (Not Seen)  Appearance: Clear Ketones: Neg mg/dL RBC/hpf: 3 - 10/hpf  Specific Gravity: 1.025 Blood: 1+ ery/uL Bacteria: NS (Not Seen)  pH: 5.5 Protein: Neg mg/dL Cystals: NS (Not Seen)  Glucose: Neg mg/dL Urobilinogen: 0.2 mg/dL Casts: NS (Not Seen)    Nitrites: Neg Trichomonas: Not Present    Leukocyte Esterase: Neg leu/uL Mucous: Not Present      Epithelial Cells: NS (Not Seen)      Yeast: NS (Not Seen)      Sperm: Not Present    ASSESSMENT:      ICD-10 Details  1 GU:   Spermatocele of epididymis, Unspec - N43.40 Worsening - His right spermatocele and worsened and he is interested in treatment. He has a small left spermatocele but it is not symptomatic and I would prefer monitor that lesion. I have reviewed the risks of the spermatocelectomy including bleeding, infection, testicular injury or atrophy, recurrence, chronic pain, thrombotic events and anesthetic complications.   2   Microscopic hematuria - R31.21 Stable - He stable hematuria.  3   Primary hypogonadism - E29.1 He has no symptoms of hypogonadism and with his last testosterone being normal, a repeat is not needed.   4   ED due to arterial insufficiency - N52.01 Improving - He has been able to get off of sildenafil.   PLAN:            Medications Stop Meds: Fentanyl 50 mcg/hour patch, transdermal 72 hours  Discontinue: 11/07/2018  - Reason: The medication cycle was completed.  Testosterone Cypionate 200 mg/ml vial 0.35 ml IM Q1WK  Start: 05/03/2017  Discontinue: 11/07/2018  - Reason: The medication cycle was completed.            Orders X-Rays: Scrotal Ultrasound          Schedule Return Visit/Planned Activity: Next Available Appointment - Schedule Surgery

## 2018-12-12 NOTE — Anesthesia Preprocedure Evaluation (Addendum)
Anesthesia Evaluation  Patient identified by MRN, date of birth, ID band Patient awake    Reviewed: Allergy & Precautions, NPO status , Patient's Chart, lab work & pertinent test results  Airway Mallampati: II  TM Distance: >3 FB Neck ROM: Full    Dental  (+) Dental Advisory Given, Edentulous Upper, Missing, Poor Dentition   Pulmonary COPD, Current Smoker,    Pulmonary exam normal breath sounds clear to auscultation       Cardiovascular negative cardio ROS Normal cardiovascular exam Rhythm:Regular Rate:Normal     Neuro/Psych PSYCHIATRIC DISORDERS Anxiety Depression negative neurological ROS     GI/Hepatic GERD  ,(+) Hepatitis -, C  Endo/Other  negative endocrine ROS  Renal/GU Renal disease     Musculoskeletal  (+) Arthritis ,   Abdominal   Peds  Hematology negative hematology ROS (+)   Anesthesia Other Findings   Reproductive/Obstetrics                                                             Anesthesia Evaluation  Patient identified by MRN, date of birth, ID band Patient awake    Reviewed: Allergy & Precautions, H&P , NPO status , Patient's Chart, lab work & pertinent test results  Airway Mallampati: I TM Distance: >3 FB     Dental  (+) Partial Lower   Pulmonary Sleep apnea: am cough. , Current Smoker,  breath sounds clear to auscultation        Cardiovascular negative cardio ROS  Rhythm:Regular Rate:Normal     Neuro/Psych PSYCHIATRIC DISORDERS Depression  Neuromuscular disease CVA, No Residual Symptoms    GI/Hepatic (+) Hepatitis -, C  Endo/Other    Renal/GU      Musculoskeletal  (+) Arthritis - (L4-L5 Fusion, sciatica,  chronic LBP),   Abdominal   Peds  Hematology   Anesthesia Other Findings   Reproductive/Obstetrics                           Anesthesia Physical Anesthesia Plan  ASA: III  Anesthesia Plan: MAC    Post-op Pain Management:    Induction: Intravenous  Airway Management Planned: Simple Face Mask  Additional Equipment:   Intra-op Plan:   Post-operative Plan:   Informed Consent: I have reviewed the patients History and Physical, chart, labs and discussed the procedure including the risks, benefits and alternatives for the proposed anesthesia with the patient or authorized representative who has indicated his/her understanding and acceptance.     Plan Discussed with:   Anesthesia Plan Comments:         Anesthesia Quick Evaluation  Anesthesia Physical Anesthesia Plan  ASA: III  Anesthesia Plan: General   Post-op Pain Management:    Induction: Intravenous  PONV Risk Score and Plan: 3 and Midazolam, Ondansetron, Dexamethasone and Treatment may vary due to age or medical condition  Airway Management Planned: LMA  Additional Equipment: None  Intra-op Plan:   Post-operative Plan: Extubation in OR  Informed Consent: I have reviewed the patients History and Physical, chart, labs and discussed the procedure including the risks, benefits and alternatives for the proposed anesthesia with the patient or authorized representative who has indicated his/her understanding and acceptance.     Dental advisory given  Plan Discussed with: CRNA  Anesthesia  Plan Comments: (See PAT note 12/12/18, Konrad Felix, PA-C)       Anesthesia Quick Evaluation

## 2018-12-13 ENCOUNTER — Ambulatory Visit (HOSPITAL_COMMUNITY)
Admission: RE | Admit: 2018-12-13 | Discharge: 2018-12-13 | Disposition: A | Discharge status: Home / Self Care | Payer: PPO | Source: Ambulatory Visit | Attending: Urology | Admitting: Urology

## 2018-12-13 ENCOUNTER — Encounter (HOSPITAL_COMMUNITY)
Admission: RE | Disposition: A | Discharge status: Home / Self Care | Payer: Self-pay | Source: Ambulatory Visit | Attending: Urology

## 2018-12-13 ENCOUNTER — Ambulatory Visit (HOSPITAL_COMMUNITY): Payer: PPO | Admitting: Physician Assistant

## 2018-12-13 ENCOUNTER — Telehealth (HOSPITAL_COMMUNITY): Payer: Self-pay | Admitting: *Deleted

## 2018-12-13 ENCOUNTER — Encounter (HOSPITAL_COMMUNITY): Payer: Self-pay | Admitting: *Deleted

## 2018-12-13 ENCOUNTER — Ambulatory Visit (HOSPITAL_COMMUNITY): Payer: PPO | Admitting: Anesthesiology

## 2018-12-13 DIAGNOSIS — Z7982 Long term (current) use of aspirin: Secondary | ICD-10-CM | POA: Diagnosis not present

## 2018-12-13 DIAGNOSIS — Z8249 Family history of ischemic heart disease and other diseases of the circulatory system: Secondary | ICD-10-CM | POA: Diagnosis not present

## 2018-12-13 DIAGNOSIS — N4340 Spermatocele of epididymis, unspecified: Secondary | ICD-10-CM

## 2018-12-13 DIAGNOSIS — K219 Gastro-esophageal reflux disease without esophagitis: Secondary | ICD-10-CM | POA: Diagnosis not present

## 2018-12-13 DIAGNOSIS — Z8261 Family history of arthritis: Secondary | ICD-10-CM | POA: Diagnosis not present

## 2018-12-13 DIAGNOSIS — F419 Anxiety disorder, unspecified: Secondary | ICD-10-CM | POA: Insufficient documentation

## 2018-12-13 DIAGNOSIS — Z841 Family history of disorders of kidney and ureter: Secondary | ICD-10-CM | POA: Diagnosis not present

## 2018-12-13 DIAGNOSIS — N4341 Spermatocele of epididymis, single: Secondary | ICD-10-CM | POA: Diagnosis not present

## 2018-12-13 DIAGNOSIS — F329 Major depressive disorder, single episode, unspecified: Secondary | ICD-10-CM | POA: Insufficient documentation

## 2018-12-13 DIAGNOSIS — E291 Testicular hypofunction: Secondary | ICD-10-CM | POA: Insufficient documentation

## 2018-12-13 DIAGNOSIS — B1920 Unspecified viral hepatitis C without hepatic coma: Secondary | ICD-10-CM | POA: Diagnosis not present

## 2018-12-13 DIAGNOSIS — I771 Stricture of artery: Secondary | ICD-10-CM | POA: Insufficient documentation

## 2018-12-13 DIAGNOSIS — Z79899 Other long term (current) drug therapy: Secondary | ICD-10-CM | POA: Diagnosis not present

## 2018-12-13 DIAGNOSIS — R3129 Other microscopic hematuria: Secondary | ICD-10-CM | POA: Insufficient documentation

## 2018-12-13 DIAGNOSIS — F17200 Nicotine dependence, unspecified, uncomplicated: Secondary | ICD-10-CM | POA: Diagnosis not present

## 2018-12-13 DIAGNOSIS — M1990 Unspecified osteoarthritis, unspecified site: Secondary | ICD-10-CM | POA: Insufficient documentation

## 2018-12-13 DIAGNOSIS — Z8262 Family history of osteoporosis: Secondary | ICD-10-CM | POA: Diagnosis not present

## 2018-12-13 HISTORY — PX: SPERMATOCELECTOMY: SHX2420

## 2018-12-13 SURGERY — EXCISION, SPERMATOCELE
Anesthesia: General | Laterality: Right

## 2018-12-13 MED ORDER — ONDANSETRON HCL 4 MG/2ML IJ SOLN
INTRAMUSCULAR | Status: AC
  Filled 2018-12-13: qty 2

## 2018-12-13 MED ORDER — MEPERIDINE HCL 50 MG/ML IJ SOLN: 6.2500 mg | INTRAMUSCULAR | Status: DC | PRN

## 2018-12-13 MED ORDER — LIDOCAINE 2% (20 MG/ML) 5 ML SYRINGE
INTRAMUSCULAR | Status: AC
  Filled 2018-12-13: qty 5

## 2018-12-13 MED ORDER — CEFAZOLIN SODIUM-DEXTROSE 2-4 GM/100ML-% IV SOLN
2.0000 g | INTRAVENOUS | Status: AC
  Administered 2018-12-13: 2 g via INTRAVENOUS
  Filled 2018-12-13: qty 100

## 2018-12-13 MED ORDER — ROCURONIUM BROMIDE 100 MG/10ML IV SOLN
INTRAVENOUS | Status: DC | PRN
  Administered 2018-12-13: 5 mg via INTRAVENOUS

## 2018-12-13 MED ORDER — DEXAMETHASONE SODIUM PHOSPHATE 10 MG/ML IJ SOLN
INTRAMUSCULAR | Status: AC
  Filled 2018-12-13: qty 1

## 2018-12-13 MED ORDER — LACTATED RINGERS IV SOLN
INTRAVENOUS | Status: DC
  Administered 2018-12-13: 08:00:00 via INTRAVENOUS

## 2018-12-13 MED ORDER — 0.9 % SODIUM CHLORIDE (POUR BTL) OPTIME
TOPICAL | Status: DC | PRN
  Administered 2018-12-13: 1000 mL

## 2018-12-13 MED ORDER — HYDROMORPHONE HCL 1 MG/ML IJ SOLN
INTRAMUSCULAR | Status: AC
  Filled 2018-12-13: qty 2

## 2018-12-13 MED ORDER — SUCCINYLCHOLINE CHLORIDE 200 MG/10ML IV SOSY
PREFILLED_SYRINGE | INTRAVENOUS | Status: AC
  Filled 2018-12-13: qty 10

## 2018-12-13 MED ORDER — ACETAMINOPHEN 325 MG PO TABS: 650.0000 mg | ORAL_TABLET | ORAL | Status: DC | PRN

## 2018-12-13 MED ORDER — PROPOFOL 10 MG/ML IV BOLUS
INTRAVENOUS | Status: AC
  Filled 2018-12-13: qty 40

## 2018-12-13 MED ORDER — FENTANYL CITRATE (PF) 100 MCG/2ML IJ SOLN
INTRAMUSCULAR | Status: DC | PRN
  Administered 2018-12-13 (×3): 50 ug via INTRAVENOUS
  Administered 2018-12-13: 100 ug via INTRAVENOUS

## 2018-12-13 MED ORDER — KETAMINE HCL 50 MG/ML IJ SOLN
INTRAMUSCULAR | Status: DC | PRN
  Administered 2018-12-13: 30 mg via INTRAMUSCULAR

## 2018-12-13 MED ORDER — LIDOCAINE HCL (CARDIAC) PF 100 MG/5ML IV SOSY
PREFILLED_SYRINGE | INTRAVENOUS | Status: DC | PRN
  Administered 2018-12-13: 60 mg via INTRATRACHEAL

## 2018-12-13 MED ORDER — ACETAMINOPHEN 650 MG RE SUPP
650.0000 mg | RECTAL | Status: DC | PRN
  Filled 2018-12-13: qty 1

## 2018-12-13 MED ORDER — MORPHINE SULFATE (PF) 4 MG/ML IV SOLN: 2.0000 mg | INTRAVENOUS | Status: DC | PRN

## 2018-12-13 MED ORDER — OXYCODONE HCL 5 MG PO TABS: 5.0000 mg | ORAL_TABLET | Freq: Once | ORAL | Status: DC | PRN

## 2018-12-13 MED ORDER — BUPIVACAINE HCL 0.25 % IJ SOLN
INTRAMUSCULAR | Status: DC | PRN
  Administered 2018-12-13: 14 mL

## 2018-12-13 MED ORDER — BUPIVACAINE HCL (PF) 0.25 % IJ SOLN
INTRAMUSCULAR | Status: AC
  Filled 2018-12-13: qty 30

## 2018-12-13 MED ORDER — DEXAMETHASONE SODIUM PHOSPHATE 10 MG/ML IJ SOLN
INTRAMUSCULAR | Status: DC | PRN
  Administered 2018-12-13: 10 mg via INTRAVENOUS

## 2018-12-13 MED ORDER — FENTANYL CITRATE (PF) 250 MCG/5ML IJ SOLN
INTRAMUSCULAR | Status: AC
  Filled 2018-12-13: qty 5

## 2018-12-13 MED ORDER — EPHEDRINE SULFATE 50 MG/ML IJ SOLN
INTRAMUSCULAR | Status: DC | PRN
  Administered 2018-12-13: 5 mg via INTRAVENOUS
  Administered 2018-12-13: 7 mg via INTRAVENOUS

## 2018-12-13 MED ORDER — PROMETHAZINE HCL 25 MG/ML IJ SOLN: 6.2500 mg | INTRAMUSCULAR | Status: DC | PRN

## 2018-12-13 MED ORDER — OXYCODONE HCL 5 MG PO TABS: 5.0000 mg | ORAL_TABLET | ORAL | Status: DC | PRN

## 2018-12-13 MED ORDER — OXYCODONE HCL 5 MG/5ML PO SOLN: 5.0000 mg | Freq: Once | ORAL | Status: DC | PRN

## 2018-12-13 MED ORDER — SUCCINYLCHOLINE CHLORIDE 20 MG/ML IJ SOLN
INTRAMUSCULAR | Status: DC | PRN
  Administered 2018-12-13: 120 mg via INTRAVENOUS

## 2018-12-13 MED ORDER — MIDAZOLAM HCL 5 MG/5ML IJ SOLN
INTRAMUSCULAR | Status: DC | PRN
  Administered 2018-12-13: 2 mg via INTRAVENOUS

## 2018-12-13 MED ORDER — ROCURONIUM BROMIDE 100 MG/10ML IV SOLN
INTRAVENOUS | Status: AC
  Filled 2018-12-13: qty 1

## 2018-12-13 MED ORDER — SODIUM CHLORIDE 0.9 % IV SOLN: 250.0000 mL | INTRAVENOUS | Status: DC | PRN

## 2018-12-13 MED ORDER — PROPOFOL 10 MG/ML IV BOLUS
INTRAVENOUS | Status: DC | PRN
  Administered 2018-12-13: 160 mg via INTRAVENOUS

## 2018-12-13 MED ORDER — HYDROMORPHONE HCL 1 MG/ML IJ SOLN
0.2500 mg | INTRAMUSCULAR | Status: DC | PRN
  Administered 2018-12-13: 0.5 mg via INTRAVENOUS

## 2018-12-13 MED ORDER — SODIUM CHLORIDE 0.9% FLUSH: 3.0000 mL | Freq: Two times a day (BID) | INTRAVENOUS | Status: DC

## 2018-12-13 MED ORDER — KETAMINE HCL 10 MG/ML IJ SOLN
INTRAMUSCULAR | Status: AC
  Filled 2018-12-13: qty 1

## 2018-12-13 MED ORDER — EPHEDRINE 5 MG/ML INJ
INTRAVENOUS | Status: AC
  Filled 2018-12-13: qty 10

## 2018-12-13 MED ORDER — SODIUM CHLORIDE 0.9% FLUSH: 3.0000 mL | INTRAVENOUS | Status: DC | PRN

## 2018-12-13 MED ORDER — MIDAZOLAM HCL 2 MG/2ML IJ SOLN
INTRAMUSCULAR | Status: AC
  Filled 2018-12-13: qty 2

## 2018-12-13 MED ORDER — KETOROLAC TROMETHAMINE 30 MG/ML IJ SOLN
INTRAMUSCULAR | Status: AC
  Filled 2018-12-13: qty 1

## 2018-12-13 SURGICAL SUPPLY — 24 items
BENZOIN TINCTURE PRP APPL 2/3 (GAUZE/BANDAGES/DRESSINGS) ×3 IMPLANT
BLADE HEX COATED 2.75 (ELECTRODE) ×3 IMPLANT
BNDG GAUZE ELAST 4 BULKY (GAUZE/BANDAGES/DRESSINGS) ×3 IMPLANT
CATH URET 5FR 28IN OPEN ENDED (CATHETERS) IMPLANT
COVER WAND RF STERILE (DRAPES) IMPLANT
DECANTER SPIKE VIAL GLASS SM (MISCELLANEOUS) ×3 IMPLANT
DRAIN PENROSE 18X1/2 LTX STRL (DRAIN) ×3 IMPLANT
DRAIN PENROSE 18X1/4 LTX STRL (WOUND CARE) ×3 IMPLANT
ELECT REM PT RETURN 15FT ADLT (MISCELLANEOUS) ×3 IMPLANT
GAUZE SPONGE 4X4 12PLY STRL (GAUZE/BANDAGES/DRESSINGS) ×3 IMPLANT
GLOVE SURG SS PI 8.0 STRL IVOR (GLOVE) ×3 IMPLANT
GOWN STRL REUS W/TWL XL LVL3 (GOWN DISPOSABLE) ×3 IMPLANT
KIT BASIN OR (CUSTOM PROCEDURE TRAY) ×3 IMPLANT
NS IRRIG 1000ML POUR BTL (IV SOLUTION) ×3 IMPLANT
PACK GENERAL/GYN (CUSTOM PROCEDURE TRAY) ×3 IMPLANT
SPONGE LAP 4X18 RFD (DISPOSABLE) ×6 IMPLANT
SUPPORT SCROTAL LG STRP (MISCELLANEOUS) ×2 IMPLANT
SUPPORTER ATHLETIC LG (MISCELLANEOUS) ×1
SUT CHROMIC 3 0 SH 27 (SUTURE) ×6 IMPLANT
SUT CHROMIC 4 0 SH 27 (SUTURE) IMPLANT
SUT VIC AB 3-0 SH 27 (SUTURE) ×2
SUT VIC AB 3-0 SH 27XBRD (SUTURE) ×1 IMPLANT
SUT VICRYL 0 TIES 12 18 (SUTURE) ×3 IMPLANT
WATER STERILE IRR 1000ML POUR (IV SOLUTION) IMPLANT

## 2018-12-13 NOTE — Transfer of Care (Signed)
Immediate Anesthesia Transfer of Care Note  Patient: Shawn Russell.  Procedure(s) Performed: SPERMATOCELECTOMY (Right )  Patient Location: PACU  Anesthesia Type:General  Level of Consciousness: awake, alert , oriented and patient cooperative  Airway & Oxygen Therapy: Patient Spontanous Breathing and Patient connected to face mask oxygen  Post-op Assessment: Report given to RN and Post -op Vital signs reviewed and stable  Post vital signs: Reviewed and stable  Last Vitals:  Vitals Value Taken Time  BP 100/63 12/13/2018 12:22 PM  Temp    Pulse 76 12/13/2018 12:24 PM  Resp 16 12/13/2018 12:24 PM  SpO2 100 % 12/13/2018 12:24 PM  Vitals shown include unvalidated device data.  Last Pain:  Vitals:   12/13/18 0803  TempSrc: Oral         Complications: No apparent anesthesia complications

## 2018-12-13 NOTE — Anesthesia Procedure Notes (Signed)
Procedure Name: Intubation Date/Time: 12/13/2018 11:34 AM Performed by: Garrel Ridgel, CRNA Pre-anesthesia Checklist: Patient identified, Emergency Drugs available, Suction available, Patient being monitored and Timeout performed Patient Re-evaluated:Patient Re-evaluated prior to induction Oxygen Delivery Method: Circle system utilized Preoxygenation: Pre-oxygenation with 100% oxygen Induction Type: IV induction, Cricoid Pressure applied and Rapid sequence Laryngoscope Size: Mac and 4 Grade View: Grade I Tube type: Oral Tube size: 7.5 mm Number of attempts: 1 Airway Equipment and Method: Stylet Placement Confirmation: ETT inserted through vocal cords under direct vision,  positive ETCO2 and breath sounds checked- equal and bilateral Secured at: 22 cm Tube secured with: Tape Dental Injury: Teeth and Oropharynx as per pre-operative assessment

## 2018-12-13 NOTE — Op Note (Signed)
Procedure: Right spermatocelectomy.  Preop diagnosis: Right spermatocele.  Postop diagnosis: Same.  Surgeon: Dr. Irine Seal.  Anesthesia: General.  Specimen: None.  Drain: None.  EBL: Minimal.  Complications: None.  Counts: Correct.  Indications: The patient is a 58 year old male with a symptomatic right spermatocele who is elected spermatocelectomy.  Procedure: He was given 2 g of Ancef.  A general anesthetic was induced with the patient in the supine position.  He was fitted with PAS hose.  His genitalia had been clipped by the patient.  He was prepped with Betadine solution and draped in usual sterile fashion.  The right cord was isolated in the upper scrotum and infiltrated with 5 mL of quarter percent Marcaine.  A right anterior oblique scrotal incision was made approximately 4 to 5 cm in length with a knife and carried through the dartos with the Bovie.  The testicle was delivered from the wound within the tunica vaginalis.  The tunica vaginalis was opened, exposing the testicle and the spermatocele which was beneath the epididymis and approximately 4 cm in greatest dimension.  The spermatocele was dissected down to its neck which was on the posterior mid testicle and the sac was removed.  The site of origin was oversewn with two 3-0 chromic figure-of-eight sutures.  2 smaller cysts were marsupialized.  Once hemostasis was assured, the testicle was delivered back into the right hemiscrotum.  The dartos was closed with a running 3-0 chromic suture and the skin was closed with a running vertical mattress 3-0 chromic suture.  An additional 9 mL of quarter percent Marcaine had been infiltrated into the wound edges prior to the final layer of the closure.  A dressing of 4 x 4's, fluffed Curlex and a scrotal support was placed.  His anesthetic was reversed and he was moved to the recovery room in stable condition.  There were no complications.

## 2018-12-13 NOTE — Interval H&P Note (Signed)
History and Physical Interval Note:  12/13/2018 11:05 AM  Shawn Russell.  has presented today for surgery, with the diagnosis of RIGHT SPERMATOCELE  The various methods of treatment have been discussed with the patient and family. After consideration of risks, benefits and other options for treatment, the patient has consented to  Procedure(s): SPERMATOCELECTOMY (Right) as a surgical intervention .  The patient's history has been reviewed, patient examined, no change in status, stable for surgery.  I have reviewed the patient's chart and labs.  Questions were answered to the patient's satisfaction.     Irine Seal

## 2018-12-14 ENCOUNTER — Encounter (HOSPITAL_COMMUNITY): Payer: Self-pay | Admitting: Urology

## 2018-12-14 NOTE — Anesthesia Postprocedure Evaluation (Signed)
Anesthesia Post Note  Patient: Shawn Russell.  Procedure(s) Performed: SPERMATOCELECTOMY (Right )     Patient location during evaluation: PACU Anesthesia Type: General Level of consciousness: sedated and patient cooperative Pain management: pain level controlled Vital Signs Assessment: post-procedure vital signs reviewed and stable Respiratory status: spontaneous breathing Cardiovascular status: stable Anesthetic complications: no    Last Vitals:  Vitals:   12/13/18 1324 12/13/18 1336  BP: 108/74 113/78  Pulse: 61 (!) 57  Resp: 11   Temp: 36.6 C 36.8 C  SpO2: 95% 98%    Last Pain:  Vitals:   12/13/18 1336  TempSrc:   PainSc: [place]

## 2018-12-17 ENCOUNTER — Telehealth: Payer: Self-pay

## 2018-12-17 NOTE — Telephone Encounter (Signed)
Noted. Pt notified that he can take Movantik 25 mg daily. Pt wants to double up on Movantik 12.5 mg pills that he has and if taking the 25 mg helps, pt will call back for the RX. Will send Movantik 25 mg when pt calls back per pts request.

## 2018-12-17 NOTE — Telephone Encounter (Signed)
Increase Movantik to 25 mg daily

## 2018-12-17 NOTE — Telephone Encounter (Signed)
Pt called to ask if it was ok for him to take Movantik 12.5 mg twice a day instead of once daily. Pt was seen on 12/19 for abdominal pain with 1/2 bowel movements weekly. He was asked to call back in 2 weeks with a progress report. Pt was also asked to d/c Doculax since it wasn't helping him have regular bowel movements. Pt started Movantik 12.5 mg on 12/19 and isn't emptying out all the way when he has his bowel movements. Pt's last bowel movement was 3 days ago and he wants to know if he should double his pill. Pt reports some mild discomfort due to needed to have a bowel movement.

## 2018-12-27 DIAGNOSIS — N4340 Spermatocele of epididymis, unspecified: Secondary | ICD-10-CM | POA: Diagnosis not present

## 2018-12-31 ENCOUNTER — Telehealth: Payer: Self-pay | Admitting: Internal Medicine

## 2018-12-31 MED ORDER — LUBIPROSTONE 24 MCG PO CAPS: 24.0000 ug | ORAL_CAPSULE | Freq: Two times a day (BID) | ORAL | 5 refills | Status: DC

## 2018-12-31 NOTE — Telephone Encounter (Signed)
Left a detailed message for pt to start new medication per pts request.

## 2018-12-31 NOTE — Telephone Encounter (Signed)
Stop Movantik. Start Amitiza 22mcg BID. RX sent to pharmacy. Call if ongoing concerns.

## 2018-12-31 NOTE — Telephone Encounter (Signed)
Pt has been taking Movantik 25 mg daily for 2 weeks. He previously took Movantik 12.5mg  for a couple weeks with no improvement with his constipation. Pt has taken two enemas with the Movantik 25 mg. Pt states that he is eating fiber, increasing his fluids and still straining to have a bowel movement. He is going several days before he's able to have a bowel movement. Pt was asked to d/c ducolax by RMR and start the movantik on 12/04/18 . Please advise in the absence of RMR.

## 2018-12-31 NOTE — Telephone Encounter (Signed)
Pt called to say that the movantik isn't helping him and was there anything else RMR could recommend. Please call [phone]

## 2019-01-28 DIAGNOSIS — G4700 Insomnia, unspecified: Secondary | ICD-10-CM | POA: Diagnosis not present

## 2019-01-28 DIAGNOSIS — G894 Chronic pain syndrome: Secondary | ICD-10-CM | POA: Diagnosis not present

## 2019-01-28 DIAGNOSIS — M961 Postlaminectomy syndrome, not elsewhere classified: Secondary | ICD-10-CM | POA: Diagnosis not present

## 2019-01-28 DIAGNOSIS — M62838 Other muscle spasm: Secondary | ICD-10-CM | POA: Diagnosis not present

## 2019-01-28 DIAGNOSIS — M150 Primary generalized (osteo)arthritis: Secondary | ICD-10-CM | POA: Diagnosis not present

## 2019-01-28 DIAGNOSIS — M25562 Pain in left knee: Secondary | ICD-10-CM | POA: Diagnosis not present

## 2019-01-28 DIAGNOSIS — F4323 Adjustment disorder with mixed anxiety and depressed mood: Secondary | ICD-10-CM | POA: Diagnosis not present

## 2019-01-28 DIAGNOSIS — Z79891 Long term (current) use of opiate analgesic: Secondary | ICD-10-CM | POA: Diagnosis not present

## 2019-01-28 DIAGNOSIS — K5903 Drug induced constipation: Secondary | ICD-10-CM | POA: Diagnosis not present

## 2019-01-28 DIAGNOSIS — M47812 Spondylosis without myelopathy or radiculopathy, cervical region: Secondary | ICD-10-CM | POA: Diagnosis not present

## 2019-01-28 DIAGNOSIS — F329 Major depressive disorder, single episode, unspecified: Secondary | ICD-10-CM | POA: Diagnosis not present

## 2019-02-06 ENCOUNTER — Telehealth: Payer: Self-pay | Admitting: Family Medicine

## 2019-02-07 ENCOUNTER — Ambulatory Visit: Payer: PPO

## 2019-02-14 ENCOUNTER — Ambulatory Visit: Payer: PPO

## 2019-03-05 ENCOUNTER — Ambulatory Visit: Payer: PPO | Admitting: *Deleted

## 2019-03-05 ENCOUNTER — Other Ambulatory Visit: Payer: Self-pay

## 2019-03-05 ENCOUNTER — Telehealth: Payer: Self-pay | Admitting: *Deleted

## 2019-03-05 ENCOUNTER — Ambulatory Visit: Payer: PPO | Admitting: Gastroenterology

## 2019-03-05 NOTE — Telephone Encounter (Signed)
Called patient and had to LMOVM for telephone visit with LSL today. No call back prior to 10:45am.

## 2019-03-06 ENCOUNTER — Telehealth: Payer: Self-pay | Admitting: Gastroenterology

## 2019-03-06 ENCOUNTER — Encounter: Payer: Self-pay | Admitting: Gastroenterology

## 2019-03-06 ENCOUNTER — Telehealth: Payer: Self-pay | Admitting: Internal Medicine

## 2019-03-06 MED ORDER — LINACLOTIDE 290 MCG PO CAPS: 290.0000 ug | ORAL_CAPSULE | Freq: Every day | ORAL | 3 refills | Status: DC

## 2019-03-06 NOTE — Telephone Encounter (Signed)
PATIENT WAS A NO SHOW AND LETTER SENT  °

## 2019-03-06 NOTE — Telephone Encounter (Signed)
Spoke with pt. He was Taken Marketing executive. Pt feels neither medication made his bowels move when used daily. Pt went back to Doculax this week and has a bowel movement q 4 days. Pt says he feels miserable now since he hasn't had a bowel movement in a few days. Pt also reports straining when having a bowel movement even when medication was taken.   Pt was scheduled for an ov on 5/20 and missed it due to not hearing his home phone . Pt was r/s to 03/13/19.

## 2019-03-06 NOTE — Addendum Note (Signed)
Addended by: Mahala Menghini on: 03/06/2019 12:24 PM   Modules accepted: Orders

## 2019-03-06 NOTE — Telephone Encounter (Signed)
PATIENT CALLED AND SAID THAT HE NEEDS SOMETHING SENT TO HIS PHARMACY FOR CONSTIPATION-THE LAST 2 MEDS WE SENT IN FOR HIM HE SAID, DID NOT WORK

## 2019-03-06 NOTE — Telephone Encounter (Signed)
Left a detailed message on pts machine. 

## 2019-03-06 NOTE — Telephone Encounter (Signed)
I can't tell what is on his formulary. Failed Amitiza and Movantik. I am sending in [place] 248mcg daily to take every day. Take on empty stomach. Let us know if not covered.   For immediate relief, he can drink one bottle of magnexium citrate which is over the counter.

## 2019-03-12 ENCOUNTER — Telehealth: Payer: Self-pay | Admitting: Family Medicine

## 2019-03-13 ENCOUNTER — Ambulatory Visit: Payer: PPO | Admitting: Gastroenterology

## 2019-03-13 ENCOUNTER — Other Ambulatory Visit: Payer: Self-pay

## 2019-03-13 NOTE — Telephone Encounter (Signed)
PATIENT CANCELLED

## 2019-03-27 DIAGNOSIS — G894 Chronic pain syndrome: Secondary | ICD-10-CM | POA: Diagnosis not present

## 2019-03-27 DIAGNOSIS — Z79891 Long term (current) use of opiate analgesic: Secondary | ICD-10-CM | POA: Diagnosis not present

## 2019-03-27 DIAGNOSIS — M47812 Spondylosis without myelopathy or radiculopathy, cervical region: Secondary | ICD-10-CM | POA: Diagnosis not present

## 2019-03-27 DIAGNOSIS — M25562 Pain in left knee: Secondary | ICD-10-CM | POA: Diagnosis not present

## 2019-03-27 DIAGNOSIS — M961 Postlaminectomy syndrome, not elsewhere classified: Secondary | ICD-10-CM | POA: Diagnosis not present

## 2019-03-27 DIAGNOSIS — F329 Major depressive disorder, single episode, unspecified: Secondary | ICD-10-CM | POA: Diagnosis not present

## 2019-03-27 DIAGNOSIS — G4700 Insomnia, unspecified: Secondary | ICD-10-CM | POA: Diagnosis not present

## 2019-03-27 DIAGNOSIS — K5903 Drug induced constipation: Secondary | ICD-10-CM | POA: Diagnosis not present

## 2019-03-27 DIAGNOSIS — M150 Primary generalized (osteo)arthritis: Secondary | ICD-10-CM | POA: Diagnosis not present

## 2019-04-02 IMAGING — DX DG ABDOMEN 1V
1 series · 1 of 1 positions shown · non-contrast
Comparison: CT 11/28/2016

CLINICAL DATA: Abdominal pain

EXAM:
ABDOMEN - 1 VIEW

[abdomen kub]
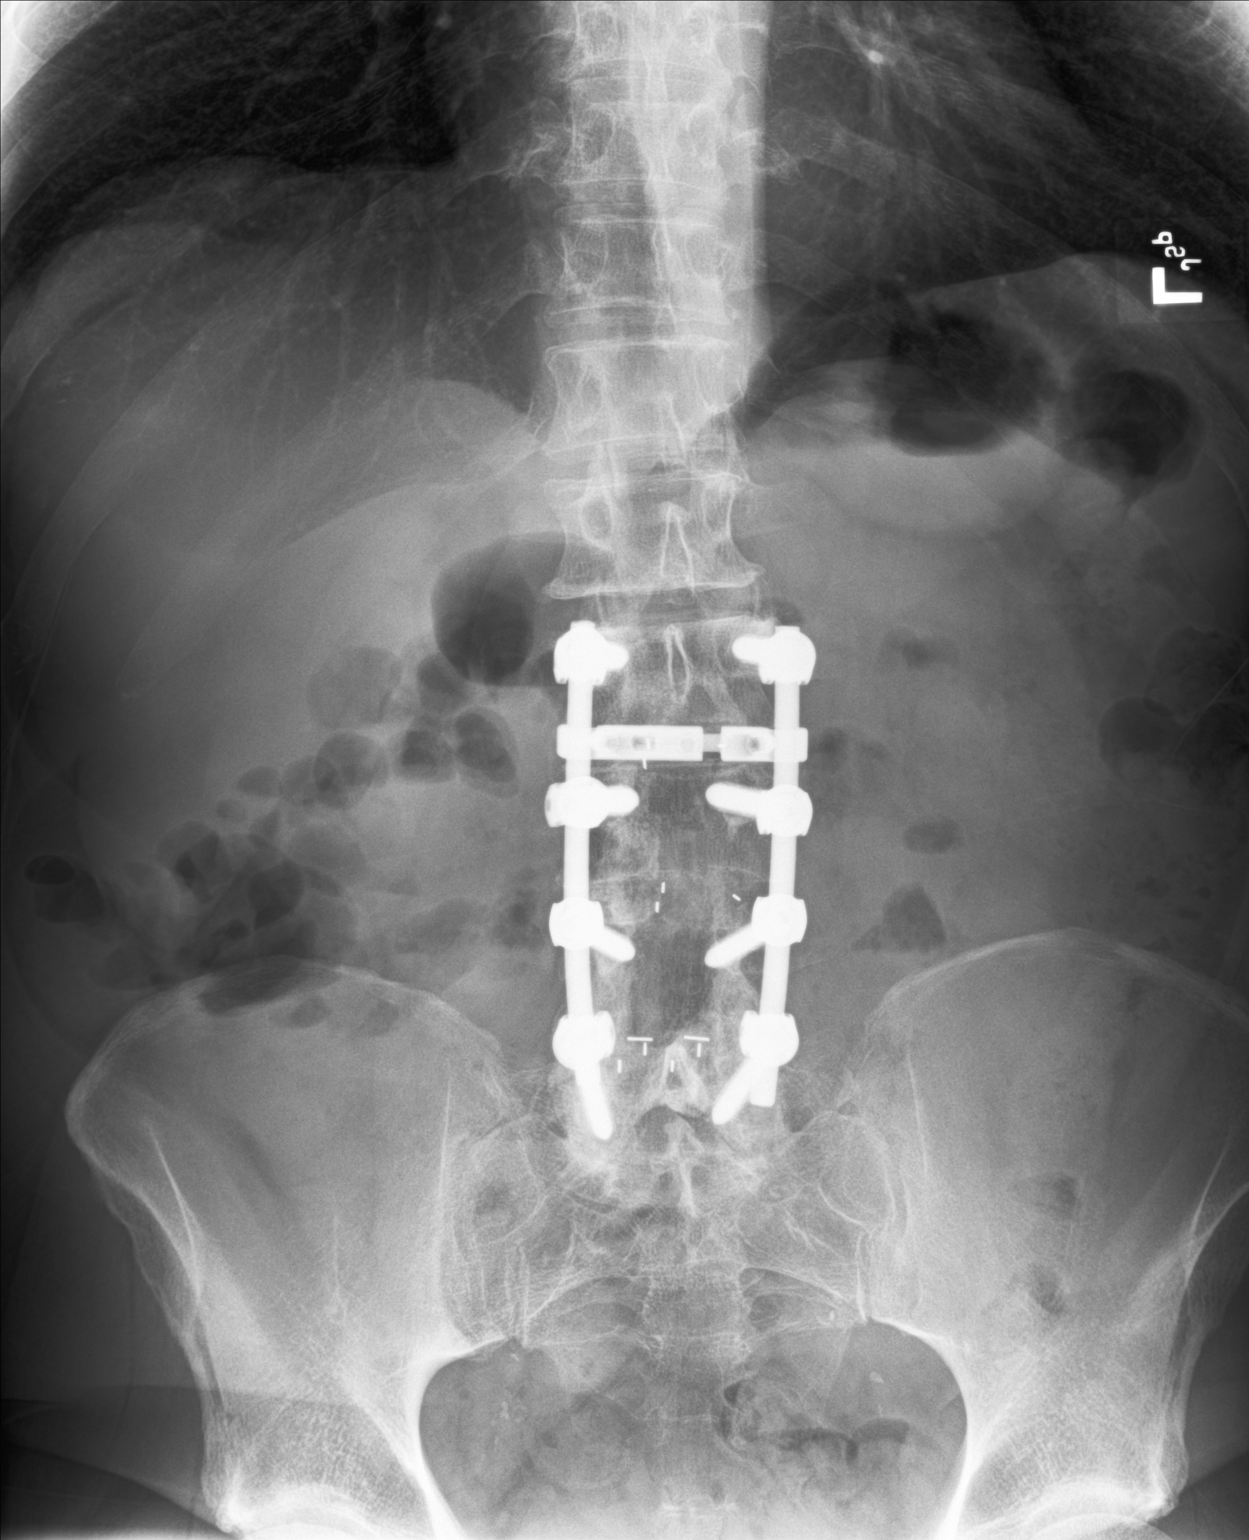

[1 of 1 positions shown; findings below may reference images not displayed]

FINDINGS: Gas pattern appears unremarkable in this 1 projection. No abnormal
calcifications. Previous lumbar spinal decompression and fusion.
IMPRESSION: No acute findings in this 1 projection.

## 2019-05-22 DIAGNOSIS — M47812 Spondylosis without myelopathy or radiculopathy, cervical region: Secondary | ICD-10-CM | POA: Diagnosis not present

## 2019-05-22 DIAGNOSIS — M961 Postlaminectomy syndrome, not elsewhere classified: Secondary | ICD-10-CM | POA: Diagnosis not present

## 2019-05-22 DIAGNOSIS — G894 Chronic pain syndrome: Secondary | ICD-10-CM | POA: Diagnosis not present

## 2019-05-22 DIAGNOSIS — Z79891 Long term (current) use of opiate analgesic: Secondary | ICD-10-CM | POA: Diagnosis not present

## 2019-06-17 DIAGNOSIS — Z79891 Long term (current) use of opiate analgesic: Secondary | ICD-10-CM | POA: Diagnosis not present

## 2019-06-17 DIAGNOSIS — G894 Chronic pain syndrome: Secondary | ICD-10-CM | POA: Diagnosis not present

## 2019-06-17 DIAGNOSIS — M961 Postlaminectomy syndrome, not elsewhere classified: Secondary | ICD-10-CM | POA: Diagnosis not present

## 2019-06-17 DIAGNOSIS — M47812 Spondylosis without myelopathy or radiculopathy, cervical region: Secondary | ICD-10-CM | POA: Diagnosis not present

## 2019-07-01 DIAGNOSIS — G894 Chronic pain syndrome: Secondary | ICD-10-CM | POA: Diagnosis not present

## 2019-07-01 DIAGNOSIS — M961 Postlaminectomy syndrome, not elsewhere classified: Secondary | ICD-10-CM | POA: Diagnosis not present

## 2019-07-01 DIAGNOSIS — Z79891 Long term (current) use of opiate analgesic: Secondary | ICD-10-CM | POA: Diagnosis not present

## 2019-07-01 DIAGNOSIS — M47812 Spondylosis without myelopathy or radiculopathy, cervical region: Secondary | ICD-10-CM | POA: Diagnosis not present

## 2019-07-29 DIAGNOSIS — M47812 Spondylosis without myelopathy or radiculopathy, cervical region: Secondary | ICD-10-CM | POA: Diagnosis not present

## 2019-07-29 DIAGNOSIS — M961 Postlaminectomy syndrome, not elsewhere classified: Secondary | ICD-10-CM | POA: Diagnosis not present

## 2019-07-29 DIAGNOSIS — Z79891 Long term (current) use of opiate analgesic: Secondary | ICD-10-CM | POA: Diagnosis not present

## 2019-07-29 DIAGNOSIS — G894 Chronic pain syndrome: Secondary | ICD-10-CM | POA: Diagnosis not present

## 2019-08-15 ENCOUNTER — Telehealth: Payer: Self-pay | Admitting: Internal Medicine

## 2019-08-15 MED ORDER — LINACLOTIDE 290 MCG PO CAPS: 290.0000 ug | ORAL_CAPSULE | Freq: Every day | ORAL | 5 refills | Status: DC

## 2019-08-15 NOTE — Telephone Encounter (Signed)
[phone]  Patient needs linzess prescription sent to [facility] in [place], he has changed pharmacies

## 2019-08-15 NOTE — Telephone Encounter (Signed)
Routing to [facility] refill. Linzess 290 mcg one daily.

## 2019-08-15 NOTE — Addendum Note (Signed)
Addended by: Mahala Menghini on: 08/15/2019 04:07 PM   Modules accepted: Orders

## 2019-09-23 DIAGNOSIS — M961 Postlaminectomy syndrome, not elsewhere classified: Secondary | ICD-10-CM | POA: Diagnosis not present

## 2019-09-23 DIAGNOSIS — G894 Chronic pain syndrome: Secondary | ICD-10-CM | POA: Diagnosis not present

## 2019-09-23 DIAGNOSIS — M25552 Pain in left hip: Secondary | ICD-10-CM | POA: Diagnosis not present

## 2019-09-23 DIAGNOSIS — M47812 Spondylosis without myelopathy or radiculopathy, cervical region: Secondary | ICD-10-CM | POA: Diagnosis not present

## 2019-10-15 ENCOUNTER — Encounter: Payer: Self-pay | Admitting: Family Medicine

## 2019-10-15 ENCOUNTER — Ambulatory Visit (INDEPENDENT_AMBULATORY_CARE_PROVIDER_SITE_OTHER): Payer: PPO | Admitting: Family Medicine

## 2019-10-15 ENCOUNTER — Other Ambulatory Visit: Payer: Self-pay

## 2019-10-15 ENCOUNTER — Ambulatory Visit (INDEPENDENT_AMBULATORY_CARE_PROVIDER_SITE_OTHER): Payer: PPO

## 2019-10-15 ENCOUNTER — Telehealth: Payer: Self-pay | Admitting: Family Medicine

## 2019-10-15 VITALS — BP 118/77 | HR 67 | Temp 99.0°F | Ht 71.0 in | Wt 200.0 lb

## 2019-10-15 DIAGNOSIS — W19XXXA Unspecified fall, initial encounter: Secondary | ICD-10-CM

## 2019-10-15 DIAGNOSIS — S8011XA Contusion of right lower leg, initial encounter: Secondary | ICD-10-CM

## 2019-10-15 DIAGNOSIS — M79604 Pain in right leg: Secondary | ICD-10-CM

## 2019-10-15 DIAGNOSIS — M79661 Pain in right lower leg: Secondary | ICD-10-CM | POA: Diagnosis not present

## 2019-10-15 NOTE — Progress Notes (Signed)
Subjective: CC: Fall PCP: Dettinger, Fransisca Kaufmann, MD [id number] Shawn Russell. is a 58 y.o. male presenting to clinic today for:  1.  Fall Patient reports yesterday he fell off of a ladder where his leg went through the ladder.  He subsequently developed pain and swelling along the anterior right shin.  He has been trying to keep off of it in efforts to improve pain.  He has chronic pain medication which he has been using as well as aspirin.  He wanted to get checked out because he has had fracture in the RLE previously that required surgical intervention.  He has a limp but is ambulating independently.   ROS: Per HPI  No Known Allergies Past Medical History:  Diagnosis Date  . Chronic back pain   . Chronic constipation   . Chronic hepatitis C without hepatic coma ([place])    blood transfusion 1988;   per pt was treated with mediation approx. 2010 was followed by liver clinic @ [facility] and has been cured  . COPD (chronic obstructive pulmonary disease) ([place])   . Degenerative disc disease, lumbar   . Depression   . ED (erectile dysfunction)   . GERD (gastroesophageal reflux disease)   . Hearing loss of both ears    per pt has hearing aids but does not wear  . History of esophageal dilatation 2014  . History of hypogonadism   . History of kidney stones   . History of TIAs    per pt approx. 2004  . OA (osteoarthritis)    knees  . Renal cyst, left   . Spinal stenosis     Current Outpatient Medications:  .  aspirin 325 MG tablet, Take 325 mg by mouth daily., Disp: , Rfl:  .  bisacodyl (DULCOLAX) 5 MG EC tablet, Take 5 mg by mouth daily as needed for moderate constipation., Disp: , Rfl:  .  fentaNYL (DURAGESIC) 50 MCG/HR, Place 1 patch onto the skin every other day., Disp: , Rfl:  .  HYDROmorphone (DILAUDID) 4 MG tablet, Take 4 mg by mouth every 4 (four) hours. , Disp: , Rfl:  .  linaclotide (LINZESS) 290 MCG CAPS capsule, Take 1 capsule (290 mcg total) by mouth daily before breakfast.,  Disp: 30 capsule, Rfl: 5 .  lubiprostone (AMITIZA) 24 MCG capsule, Take 1 capsule (24 mcg total) by mouth 2 (two) times daily with a meal., Disp: 60 capsule, Rfl: 5 .  morphine (MS CONTIN) 60 MG 12 hr tablet, Take 60 mg by mouth 2 (two) times daily., Disp: , Rfl:  .  naloxegol oxalate (MOVANTIK) 12.5 MG TABS tablet, Take 1 tablet (12.5 mg total) by mouth daily., Disp: 30 tablet, Rfl: 3 .  pantoprazole (PROTONIX) 40 MG tablet, TAKE 1 TABLET ONCE A DAY (Patient not taking: Reported on 11/15/2018), Disp: 90 tablet, Rfl: 1 .  traZODone (DESYREL) 50 MG tablet, TAKE 1/2 TO 1 TABLET AT BEDTIME AS NEEDED FOR SLEEP (Patient not taking: Reported on 12/04/2018), Disp: 30 tablet, Rfl: 0 Social History   Socioeconomic History  . Marital status: Married    Spouse name: Not on file  . Number of children: Not on file  . Years of education: Not on file  . Highest education level: Not on file  Occupational History  . Not on file  Tobacco Use  . Smoking status: Current Every Day Smoker    Packs/day: 1.00    Years: 40.00    Pack years: 40.00    Types: Cigarettes  .  Smokeless tobacco: Never Used  Substance and Sexual Activity  . Alcohol use: No  . Drug use: No  . Sexual activity: Not Currently  Other Topics Concern  . Not on file  Social History Narrative  . Not on file   Social Determinants of Health   Financial Resource Strain:   . Difficulty of Paying Living Expenses: Not on file  Food Insecurity:   . Worried About Charity fundraiser in the Last Year: Not on file  . Ran Out of Food in the Last Year: Not on file  Transportation Needs:   . Lack of Transportation (Medical): Not on file  . Lack of Transportation (Non-Medical): Not on file  Physical Activity:   . Days of Exercise per Week: Not on file  . Minutes of Exercise per Session: Not on file  Stress:   . Feeling of Stress : Not on file  Social Connections:   . Frequency of Communication with Friends and Family: Not on file  .  Frequency of Social Gatherings with Friends and Family: Not on file  . Attends Religious Services: Not on file  . Active Member of Clubs or Organizations: Not on file  . Attends Archivist Meetings: Not on file  . Marital Status: Not on file  Intimate Partner Violence:   . Fear of Current or Ex-Partner: Not on file  . Emotionally Abused: Not on file  . Physically Abused: Not on file  . Sexually Abused: Not on file   Family History  Problem Relation Age of Onset  . Heart disease Mother   . Osteoarthritis Mother   . Heart disease Father   . Colon cancer Neg Hx   . Liver disease Neg Hx     Objective: Office vital signs reviewed. BP 118/77   Pulse 67   Temp 99 F (37.2 C) (Temporal)   Ht 5\' 11"  (1.803 m)   Wt 200 lb (90.7 kg)   SpO2 97%   BMI 27.89 kg/m   Physical Examination:  General: Awake, alert, well nourished, No acute distress Extremities: warm, well perfused, No edema, cyanosis or clubbing; +2 pulses bilaterally MSK: antlagic gait and normal station  Right leg: he has a small hematoma along the right anterior shin. He is TTP over this area. No significant skin breakdown (has a small abrasion). No palpable bony deformity. Skin: abrasion as above.  DG Tibia/Fibula Right  Result Date: 10/15/2019 CLINICAL DATA:  Right lower leg pain after fall yesterday. EXAM: RIGHT TIBIA AND FIBULA - 2 VIEW COMPARISON:  None. FINDINGS: Status post surgical internal fixation of old distal right fibular and medial malleolar fractures. No acute fracture or dislocation is noted. No soft tissue abnormality is noted. IMPRESSION: No acute abnormality seen in the right tibia or fibula. Electronically Signed   By: Marijo Conception M.D.   On: 10/15/2019 16:35    Assessment/ Plan: 58 y.o. male   1. Contusion of right lower leg, initial encounter No evidence of fracture. Likely contusion + hematoma.  Recommended elevation, ice.  Has ASA so declined NSAID.  2. Right leg pain - DG  Tibia/Fibula Right; Future  3. Fall, initial encounter - DG Tibia/Fibula Right; Future   No orders of the defined types were placed in this encounter.  No orders of the defined types were placed in this encounter.    Janora Norlander, DO [place] [phone]

## 2019-10-15 NOTE — Telephone Encounter (Signed)
Error

## 2019-10-15 NOTE — Patient Instructions (Signed)
Hematoma A hematoma is a collection of blood. A hematoma can happen:  Under the skin.  In an organ.  In a body space.  In a joint space.  In other tissues. The blood can thicken (clot) to form a lump that you can see and feel. The lump is often hard and may become sore and tender. The lump can be very small or very big. Most hematomas get better in a few days to weeks. However, some hematomas may be serious and need medical care. What are the causes? This condition is caused by:  An injury.  Blood that leaks under the skin.  Problems from surgeries.  Medical conditions that cause bleeding or bruising. What increases the risk? You are more likely to develop this condition if:  You are an older adult.  You use medicines that thin your blood. What are the signs or symptoms? Symptoms depend on where the hematoma is in your body.  If the hematoma is under the skin, there is: ? A firm lump on the body. ? Pain and tenderness in the area. ? Bruising. The skin above the lump may be blue, dark blue, purple-red, or yellowish.  If the hematoma is deep in the tissues or body spaces, there may be: ? Blood in the stomach. This may cause pain in the belly (abdomen), weakness, passing out (fainting), and shortness of breath. ? Blood in the head. This may cause a headache, weakness, trouble speaking or understanding speech, or passing out. How is this diagnosed? This condition is diagnosed based on:  Your medical history.  A physical exam.  Imaging tests, such as ultrasound or CT scan.  Blood tests. How is this treated? Treatment depends on the cause, size, and location of the hematoma. Treatment may include:  Doing nothing. Many hematomas go away on their own without treatment.  Surgery or close monitoring. This may be needed for large hematomas or hematomas that affect the body's organs.  Medicines. These may be given if a medical condition caused the hematoma. Follow these  instructions at home: Managing pain, stiffness, and swelling   If told, put ice on the area. ? Put ice in a plastic bag. ? Place a towel between your skin and the bag. ? Leave the ice on for 20 minutes, 2-3 times a day for the first two days.  If told, put heat on the affected area after putting ice on the area for two days. Use the heat source that your doctor tells you to use. This could be a moist heat pack or a heating pad. To do this: ? Place a towel between your skin and the heat source. ? Leave the heat on for 20-30 minutes. ? Remove the heat if your skin turns bright red. This is very important if you are unable to feel pain, heat, or cold. You may have a greater risk of getting burned.  Raise (elevate) the affected area above the level of your heart while you are sitting or lying down.  Wrap the affected area with an elastic bandage, if told by your doctor. Do not wrap the bandage too tightly.  If your hematoma is on a leg or foot and is painful, your doctor may give you crutches. Use them as told by your doctor. General instructions  Take over-the-counter and prescription medicines only as told by your doctor.  Keep all follow-up visits as told by your doctor. This is important. Contact a doctor if:  You have a fever.    The swelling or bruising gets worse.  You start to get more hematomas. Get help right away if:  Your pain gets worse.  Your pain is not getting better with medicine.  Your skin over the hematoma breaks or starts to bleed.  Your hematoma is in your chest or belly and you: ? Pass out. ? Feel weak. ? Become short of breath.  You have a hematoma on your scalp that is caused by a fall or injury, and you: ? Have a headache that gets worse. ? Have trouble speaking or understanding speech. ? Become less alert or you pass out. Summary  A hematoma is a collection of blood in any part of your body.  Most hematomas get better on their own in a few days  to weeks. Some may need medical care.  Follow instructions from your doctor about how to care for your hematoma.  Contact a doctor if the swelling or bruising gets worse, or if you are short of breath. This information is not intended to replace advice given to you by your health care provider. Make sure you discuss any questions you have with your health care provider. Document Released: 11/10/2004 Document Revised: 03/08/2018 Document Reviewed: 03/08/2018 Elsevier Patient Education  2020 [employer].

## 2019-10-28 ENCOUNTER — Other Ambulatory Visit: Payer: Self-pay

## 2019-10-28 ENCOUNTER — Ambulatory Visit (INDEPENDENT_AMBULATORY_CARE_PROVIDER_SITE_OTHER): Payer: PPO | Admitting: Family Medicine

## 2019-10-28 ENCOUNTER — Encounter: Payer: Self-pay | Admitting: Family Medicine

## 2019-10-28 DIAGNOSIS — L03115 Cellulitis of right lower limb: Secondary | ICD-10-CM | POA: Diagnosis not present

## 2019-10-28 MED ORDER — AMOXICILLIN-POT CLAVULANATE 875-125 MG PO TABS: 1.0000 | ORAL_TABLET | Freq: Two times a day (BID) | ORAL | 0 refills | Status: DC

## 2019-10-28 NOTE — Progress Notes (Signed)
Subjective:    Patient ID: Shawn Russell., male    DOB: August 27, 1961, 59 y.o.   MRN: [id number]   HPI: Shawn Russell. is a 59 y.o. male presenting for abrasion of right  leg has gotten more red. Leg went through the rung of a ladder on Dec. 28. Had XR on Dec 29. XR reported as negative for fracture. Swells during the day. Helps to elevate it. Sx return when he walks on it.    Depression screen [facility] 2/9 10/15/2019 02/20/2018 12/29/2017 08/04/2017 05/30/2017  Decreased Interest 0 0 0 0 0  Down, Depressed, Hopeless 0 0 0 0 0  PHQ - 2 Score 0 0 0 0 0  Altered sleeping 0 - - - -  Tired, decreased energy 0 - - - -  Change in appetite 0 - - - -  Feeling bad or failure about yourself  0 - - - -  Trouble concentrating 0 - - - -  Moving slowly or fidgety/restless 0 - - - -  Suicidal thoughts 0 - - - -  PHQ-9 Score 0 - - - -     Relevant past medical, surgical, family and social history reviewed and updated as indicated.  Interim medical history since our last visit reviewed. Allergies and medications reviewed and updated.  ROS:  Review of Systems  Constitutional: Negative.   HENT: Negative.   Eyes: Negative for visual disturbance.  Respiratory: Negative for cough and shortness of breath.   Cardiovascular: Negative for chest pain and leg swelling.  Gastrointestinal: Negative for abdominal pain, diarrhea, nausea and vomiting.  Genitourinary: Negative for difficulty urinating.  Musculoskeletal: Negative for arthralgias and myalgias.  Skin: Negative for rash.  Neurological: Negative for headaches.  Psychiatric/Behavioral: Negative for sleep disturbance.     Social History   Tobacco Use  Smoking Status Current Every Day Smoker  . Packs/day: 1.00  . Years: 40.00  . Pack years: 40.00  . Types: Cigarettes  Smokeless Tobacco Never Used       Objective:     Wt Readings from Last 3 Encounters:  10/15/19 200 lb (90.7 kg)  12/13/18 200 lb (90.7 kg)  12/12/18 200 lb (90.7  kg)     Exam deferred. Pt. Harboring due to COVID 19. Phone visit performed.   Assessment & Plan:   1. Cellulitis of right lower extremity     Meds ordered this encounter  Medications  . amoxicillin-clavulanate (AUGMENTIN) 875-125 MG tablet    Sig: Take 1 tablet by mouth 2 (two) times daily. Take all of this medication    Dispense:  20 tablet    Refill:  0    ELevate to prevent swelling. Should the antibiotic not decrease the symptoms over 48-72 hours consider Doppler for DVT.    Diagnoses and all orders for this visit:  Cellulitis of right lower extremity  Other orders -     amoxicillin-clavulanate (AUGMENTIN) 875-125 MG tablet; Take 1 tablet by mouth 2 (two) times daily. Take all of this medication    Virtual Visit via telephone Note  I discussed the limitations, risks, security and privacy concerns of performing an evaluation and management service by telephone and the availability of in person appointments. The patient was identified with two identifiers. Pt.expressed understanding and agreed to proceed. Pt. Is at home. Dr. Livia Snellen is in his office.  Follow Up Instructions:   I discussed the assessment and treatment plan with the patient. The patient was provided an opportunity  to ask questions and all were answered. The patient agreed with the plan and demonstrated an understanding of the instructions.   The patient was advised to call back or seek an in-person evaluation if the symptoms worsen or if the condition fails to improve as anticipated.   Total minutes including chart review and phone contact time: 8   Follow up plan: No follow-ups on file.  Shawn Fraise, MD [place]

## 2019-11-19 DIAGNOSIS — Z79891 Long term (current) use of opiate analgesic: Secondary | ICD-10-CM | POA: Diagnosis not present

## 2019-11-19 DIAGNOSIS — M961 Postlaminectomy syndrome, not elsewhere classified: Secondary | ICD-10-CM | POA: Diagnosis not present

## 2019-11-19 DIAGNOSIS — G894 Chronic pain syndrome: Secondary | ICD-10-CM | POA: Diagnosis not present

## 2019-11-19 DIAGNOSIS — M47812 Spondylosis without myelopathy or radiculopathy, cervical region: Secondary | ICD-10-CM | POA: Diagnosis not present

## 2019-12-18 DIAGNOSIS — N5201 Erectile dysfunction due to arterial insufficiency: Secondary | ICD-10-CM | POA: Diagnosis not present

## 2019-12-18 DIAGNOSIS — E291 Testicular hypofunction: Secondary | ICD-10-CM | POA: Diagnosis not present

## 2019-12-18 DIAGNOSIS — Z87442 Personal history of urinary calculi: Secondary | ICD-10-CM | POA: Diagnosis not present

## 2019-12-18 DIAGNOSIS — R3121 Asymptomatic microscopic hematuria: Secondary | ICD-10-CM | POA: Diagnosis not present

## 2020-01-13 ENCOUNTER — Encounter: Payer: Self-pay | Admitting: Family Medicine

## 2020-01-13 ENCOUNTER — Other Ambulatory Visit: Payer: Self-pay

## 2020-01-13 ENCOUNTER — Ambulatory Visit (INDEPENDENT_AMBULATORY_CARE_PROVIDER_SITE_OTHER): Payer: PPO | Admitting: Family Medicine

## 2020-01-13 VITALS — BP 127/81 | HR 99 | Temp 98.9°F | Ht 71.0 in | Wt 200.0 lb

## 2020-01-13 DIAGNOSIS — Z125 Encounter for screening for malignant neoplasm of prostate: Secondary | ICD-10-CM | POA: Diagnosis not present

## 2020-01-13 DIAGNOSIS — Z79891 Long term (current) use of opiate analgesic: Secondary | ICD-10-CM | POA: Diagnosis not present

## 2020-01-13 DIAGNOSIS — M47812 Spondylosis without myelopathy or radiculopathy, cervical region: Secondary | ICD-10-CM | POA: Diagnosis not present

## 2020-01-13 DIAGNOSIS — G894 Chronic pain syndrome: Secondary | ICD-10-CM | POA: Diagnosis not present

## 2020-01-13 DIAGNOSIS — Z0000 Encounter for general adult medical examination without abnormal findings: Secondary | ICD-10-CM | POA: Diagnosis not present

## 2020-01-13 DIAGNOSIS — M961 Postlaminectomy syndrome, not elsewhere classified: Secondary | ICD-10-CM | POA: Diagnosis not present

## 2020-01-13 NOTE — Progress Notes (Signed)
BP 127/81   Pulse 99   Temp 98.9 F (37.2 C)   Ht _0  (1.803 m)   Wt 200 lb (90.7 kg)   SpO2 97%   BMI 27.89 kg/m    Subjective:   Patient ID: Shawn Russell., male    DOB: 1961-03-26, 59 y.o.   MRN: [id number]  HPI: Shawn Russell. is a 59 y.o. male presenting on 01/13/2020 for Annual Exam (CPE)   HPI Adult well exam and physical Patient is coming in today for adult well exam and physical and yearly checkup.  He denies a major health issues.  He does have a history of hepatitis C this been treated and wants to follow-up with the liver function that he has to do every year.  Patient denies any major health issues or concerns except for his arthritis in his back and knees and hips ankles for which he sees pain management.  Relevant past medical, surgical, family and social history reviewed and updated as indicated. Interim medical history since our last visit reviewed. Allergies and medications reviewed and updated.  Review of Systems  Constitutional: Negative for chills and fever.  HENT: Negative for ear pain and tinnitus.   Eyes: Negative for pain and discharge.  Respiratory: Negative for cough, shortness of breath and wheezing.   Cardiovascular: Negative for chest pain, palpitations and leg swelling.  Gastrointestinal: Negative for abdominal pain, blood in stool, constipation and diarrhea.  Genitourinary: Negative for dysuria and hematuria.  Musculoskeletal: Negative for back pain, gait problem and myalgias.  Skin: Negative for rash.  Neurological: Negative for dizziness, weakness and headaches.  Psychiatric/Behavioral: Negative for suicidal ideas.  All other systems reviewed and are negative.   Per HPI unless specifically indicated above   Allergies as of 01/13/2020   No Known Allergies     Medication List       Accurate as of January 13, 2020  4:37 PM. If you have any questions, ask your nurse or doctor.        STOP taking these medications     amoxicillin-clavulanate 875-125 MG tablet Commonly known as: AUGMENTIN Stopped by: Fransisca Kaufmann Johara Lodwick, MD   bisacodyl 5 MG EC tablet Commonly known as: DULCOLAX Stopped by: Fransisca Kaufmann Rogina Schiano, MD     TAKE these medications   aspirin 325 MG tablet Take 325 mg by mouth daily.   HYDROmorphone 4 MG tablet Commonly known as: DILAUDID Take 4 mg by mouth every 4 (four) hours.   linaclotide 290 MCG Caps capsule Commonly known as: Linzess Take 1 capsule (290 mcg total) by mouth daily before breakfast.   morphine 60 MG 12 hr tablet Commonly known as: MS CONTIN Take 60 mg by mouth 2 (two) times daily.        Objective:   BP 127/81   Pulse 99   Temp 98.9 F (37.2 C)   Ht _1  (1.803 m)   Wt 200 lb (90.7 kg)   SpO2 97%   BMI 27.89 kg/m   Wt Readings from Last 3 Encounters:  01/13/20 200 lb (90.7 kg)  10/15/19 200 lb (90.7 kg)  12/13/18 200 lb (90.7 kg)    Physical Exam Vitals reviewed.  Constitutional:      General: He is not in acute distress.    Appearance: He is well-developed. He is not diaphoretic.  HENT:     Right Ear: External ear normal.     Left Ear: External ear normal.  Nose: Nose normal.     Mouth/Throat:     Pharynx: No oropharyngeal exudate.  Eyes:     General: No scleral icterus.       Right eye: No discharge.     Conjunctiva/sclera: Conjunctivae normal.     Pupils: Pupils are equal, round, and reactive to light.  Neck:     Thyroid: No thyromegaly.  Cardiovascular:     Rate and Rhythm: Normal rate and regular rhythm.     Heart sounds: Normal heart sounds. No murmur.  Pulmonary:     Effort: Pulmonary effort is normal. No respiratory distress.     Breath sounds: Normal breath sounds. No wheezing.  Abdominal:     General: Bowel sounds are normal. There is no distension.     Palpations: Abdomen is soft.     Tenderness: There is no abdominal tenderness. There is no guarding or rebound.  Genitourinary:    Comments: Patient sees Dr. Jeffie Pollock  who already checked him this year Musculoskeletal:        General: Normal range of motion.     Cervical back: Neck supple.  Lymphadenopathy:     Cervical: No cervical adenopathy.  Skin:    General: Skin is warm and dry.     Findings: No rash.  Neurological:     Mental Status: He is alert and oriented to person, place, and time.     Coordination: Coordination normal.  Psychiatric:        Behavior: Behavior normal.       Assessment & Plan:   Problem List Items Addressed This Visit    None    Visit Diagnoses    Well adult exam    -  Primary   Relevant Orders   CBC with Differential/Platelet   CMP14+EGFR   Lipid panel   Prostate cancer screening       Relevant Orders   PSA, total and free       Follow up plan: Return in about 1 year (around 01/12/2021), or if symptoms worsen or fail to improve, for Physical exam.  Counseling provided for all of the vaccine components Orders Placed This Encounter  Procedures  . CBC with Differential/Platelet  . CMP14+EGFR  . Lipid panel  . PSA, total and free    [name], MD [facility] 01/13/2020, 4:37 PM

## 2020-01-14 LAB — LIPID PANEL
Chol/HDL Ratio: 3.1 ratio (ref 0.0–5.0)
Cholesterol, Total: 216 mg/dL — ABNORMAL HIGH (ref 100–199)
HDL: 69 mg/dL (ref >39)
LDL Chol Calc (NIH): 136 mg/dL — ABNORMAL HIGH (ref 0–99)
Triglycerides: 65 mg/dL (ref 0–149)
VLDL Cholesterol Cal: 11 mg/dL (ref 5–40)

## 2020-01-14 LAB — CMP14+EGFR
ALT: 18 IU/L (ref 0–44)
AST: 24 IU/L (ref 0–40)
Albumin/Globulin Ratio: 1.7 (ref 1.2–2.2)
Albumin: 4.3 g/dL (ref 3.8–4.9)
Alkaline Phosphatase: 84 IU/L (ref 39–117)
BUN/Creatinine Ratio: 23 — ABNORMAL HIGH (ref 9–20)
BUN: 24 mg/dL (ref 6–24)
Bilirubin Total: <0.2 mg/dL (ref 0.0–1.2)
CO2: 24 mmol/L (ref 20–29)
Calcium: 9.6 mg/dL (ref 8.7–10.2)
Chloride: 102 mmol/L (ref 96–106)
Creatinine, Ser: 1.06 mg/dL (ref 0.76–1.27)
GFR calc Af Amer: 88 mL/min/{1.73_m2} (ref >59)
GFR calc non Af Amer: 76 mL/min/{1.73_m2} (ref >59)
Globulin, Total: 2.6 g/dL (ref 1.5–4.5)
Glucose: 82 mg/dL (ref 65–99)
Potassium: 5 mmol/L (ref 3.5–5.2)
Sodium: 140 mmol/L (ref 134–144)
Total Protein: 6.9 g/dL (ref 6.0–8.5)

## 2020-01-14 LAB — CBC WITH DIFFERENTIAL/PLATELET
Basophils Absolute: 0 10*3/uL (ref 0.0–0.2)
Basos: 1 %
EOS (ABSOLUTE): 0.2 10*3/uL (ref 0.0–0.4)
Eos: 3 %
Hematocrit: 43.7 % (ref 37.5–51.0)
Hemoglobin: 15 g/dL (ref 13.0–17.7)
Immature Grans (Abs): 0 10*3/uL (ref 0.0–0.1)
Immature Granulocytes: 0 %
Lymphocytes Absolute: 1.9 10*3/uL (ref 0.7–3.1)
Lymphs: 28 %
MCH: 32.4 pg (ref 26.6–33.0)
MCHC: 34.3 g/dL (ref 31.5–35.7)
MCV: 94 fL (ref 79–97)
Monocytes Absolute: 0.6 10*3/uL (ref 0.1–0.9)
Monocytes: 9 %
Neutrophils Absolute: 4.2 10*3/uL (ref 1.4–7.0)
Neutrophils: 59 %
Platelets: 220 10*3/uL (ref 150–450)
RBC: 4.63 x10E6/uL (ref 4.14–5.80)
RDW: 12.6 % (ref 11.6–15.4)
WBC: 7 10*3/uL (ref 3.4–10.8)

## 2020-01-14 LAB — PSA, TOTAL AND FREE
PSA, Free Pct: 40 %
PSA, Free: 0.04 ng/mL
Prostate Specific Ag, Serum: 0.1 ng/mL (ref 0.0–4.0)

## 2020-03-10 DIAGNOSIS — Z79891 Long term (current) use of opiate analgesic: Secondary | ICD-10-CM | POA: Diagnosis not present

## 2020-03-10 DIAGNOSIS — M47812 Spondylosis without myelopathy or radiculopathy, cervical region: Secondary | ICD-10-CM | POA: Diagnosis not present

## 2020-03-10 DIAGNOSIS — M961 Postlaminectomy syndrome, not elsewhere classified: Secondary | ICD-10-CM | POA: Diagnosis not present

## 2020-03-10 DIAGNOSIS — G894 Chronic pain syndrome: Secondary | ICD-10-CM | POA: Diagnosis not present

## 2020-04-15 ENCOUNTER — Ambulatory Visit: Payer: PPO | Admitting: Family Medicine

## 2020-05-05 DIAGNOSIS — Z79891 Long term (current) use of opiate analgesic: Secondary | ICD-10-CM | POA: Diagnosis not present

## 2020-05-05 DIAGNOSIS — G894 Chronic pain syndrome: Secondary | ICD-10-CM | POA: Diagnosis not present

## 2020-05-05 DIAGNOSIS — M47812 Spondylosis without myelopathy or radiculopathy, cervical region: Secondary | ICD-10-CM | POA: Diagnosis not present

## 2020-05-05 DIAGNOSIS — M961 Postlaminectomy syndrome, not elsewhere classified: Secondary | ICD-10-CM | POA: Diagnosis not present

## 2020-06-30 DIAGNOSIS — Z79891 Long term (current) use of opiate analgesic: Secondary | ICD-10-CM | POA: Diagnosis not present

## 2020-06-30 DIAGNOSIS — M47812 Spondylosis without myelopathy or radiculopathy, cervical region: Secondary | ICD-10-CM | POA: Diagnosis not present

## 2020-06-30 DIAGNOSIS — M961 Postlaminectomy syndrome, not elsewhere classified: Secondary | ICD-10-CM | POA: Diagnosis not present

## 2020-06-30 DIAGNOSIS — G894 Chronic pain syndrome: Secondary | ICD-10-CM | POA: Diagnosis not present

## 2020-07-22 ENCOUNTER — Ambulatory Visit: Payer: PPO | Admitting: Family Medicine

## 2020-07-22 ENCOUNTER — Encounter: Payer: Self-pay | Admitting: Family Medicine

## 2020-07-28 DIAGNOSIS — M961 Postlaminectomy syndrome, not elsewhere classified: Secondary | ICD-10-CM | POA: Diagnosis not present

## 2020-07-28 DIAGNOSIS — G894 Chronic pain syndrome: Secondary | ICD-10-CM | POA: Diagnosis not present

## 2020-07-28 DIAGNOSIS — Z79891 Long term (current) use of opiate analgesic: Secondary | ICD-10-CM | POA: Diagnosis not present

## 2020-07-28 DIAGNOSIS — M47812 Spondylosis without myelopathy or radiculopathy, cervical region: Secondary | ICD-10-CM | POA: Diagnosis not present

## 2020-09-23 DIAGNOSIS — M961 Postlaminectomy syndrome, not elsewhere classified: Secondary | ICD-10-CM | POA: Diagnosis not present

## 2020-09-23 DIAGNOSIS — M47812 Spondylosis without myelopathy or radiculopathy, cervical region: Secondary | ICD-10-CM | POA: Diagnosis not present

## 2020-09-23 DIAGNOSIS — Z79891 Long term (current) use of opiate analgesic: Secondary | ICD-10-CM | POA: Diagnosis not present

## 2020-09-23 DIAGNOSIS — G894 Chronic pain syndrome: Secondary | ICD-10-CM | POA: Diagnosis not present

## 2020-10-22 DIAGNOSIS — Z79891 Long term (current) use of opiate analgesic: Secondary | ICD-10-CM | POA: Diagnosis not present

## 2020-10-22 DIAGNOSIS — G894 Chronic pain syndrome: Secondary | ICD-10-CM | POA: Diagnosis not present

## 2020-10-22 DIAGNOSIS — M47812 Spondylosis without myelopathy or radiculopathy, cervical region: Secondary | ICD-10-CM | POA: Diagnosis not present

## 2020-10-22 DIAGNOSIS — M961 Postlaminectomy syndrome, not elsewhere classified: Secondary | ICD-10-CM | POA: Diagnosis not present

## 2020-11-19 ENCOUNTER — Ambulatory Visit (INDEPENDENT_AMBULATORY_CARE_PROVIDER_SITE_OTHER): Payer: PPO | Admitting: Family

## 2020-11-19 ENCOUNTER — Other Ambulatory Visit: Payer: Self-pay

## 2020-11-19 ENCOUNTER — Encounter: Payer: Self-pay | Admitting: Family

## 2020-11-19 VITALS — BP 126/76 | HR 100 | Temp 97.3°F | Ht 71.0 in | Wt 195.0 lb

## 2020-11-19 DIAGNOSIS — M47812 Spondylosis without myelopathy or radiculopathy, cervical region: Secondary | ICD-10-CM | POA: Diagnosis not present

## 2020-11-19 DIAGNOSIS — F411 Generalized anxiety disorder: Secondary | ICD-10-CM | POA: Diagnosis not present

## 2020-11-19 DIAGNOSIS — Z79891 Long term (current) use of opiate analgesic: Secondary | ICD-10-CM | POA: Diagnosis not present

## 2020-11-19 DIAGNOSIS — G894 Chronic pain syndrome: Secondary | ICD-10-CM | POA: Diagnosis not present

## 2020-11-19 DIAGNOSIS — R55 Syncope and collapse: Secondary | ICD-10-CM | POA: Diagnosis not present

## 2020-11-19 DIAGNOSIS — M961 Postlaminectomy syndrome, not elsewhere classified: Secondary | ICD-10-CM | POA: Diagnosis not present

## 2020-11-19 NOTE — Patient Instructions (Signed)
Syncope  Syncope refers to a condition in which a person temporarily loses consciousness. Syncope may also be called fainting or passing out. It is caused by a sudden decrease in blood flow to the brain. Even though most causes of syncope are not dangerous, syncope can be a sign of a serious medical problem. Your health care provider may do tests to find the reason why you are having syncope. Signs that you may be about to faint include:  Feeling dizzy or light-headed.  Feeling nauseous.  Seeing all white or all black in your field of vision.  Having cold, clammy skin. If you faint, get medical help right away. Call your local emergency services (911 in the [place] Do not drive yourself to the hospital. Follow these instructions at home: Pay attention to any changes in your symptoms. Take these actions to stay safe and to help relieve your symptoms: Lifestyle  Do not drive, use machinery, or play sports until your health care provider says it is okay.  Do not drink alcohol.  Do not use any products that contain nicotine or tobacco, such as cigarettes and e-cigarettes. If you need help quitting, ask your health care provider.  Drink enough fluid to keep your urine pale yellow. General instructions  Take over-the-counter and prescription medicines only as told by your health care provider.  If you are taking blood pressure or heart medicine, get up slowly and take several minutes to sit and then stand. This can reduce dizziness or light-headedness.  Have someone stay with you until you feel stable.  If you start to feel like you might faint, lie down right away and raise (elevate) your feet above the level of your heart. Breathe deeply and steadily. Wait until all the symptoms have passed.  Keep all follow-up visits as told by your health care provider. This is important. Get help right away if you:  Have a severe headache.  Faint once or repeatedly.  Have pain in your chest,  abdomen, or back.  Have a very fast or irregular heartbeat (palpitations).  Have pain when you breathe.  Are bleeding from your mouth or rectum, or you have black or tarry stool.  Have a seizure.  Are confused.  Have trouble walking.  Have severe weakness.  Have vision problems. These symptoms may represent a serious problem that is an emergency. Do not wait to see if your symptoms will go away. Get medical help right away. Call your local emergency services (911 in the [place] Do not drive yourself to the hospital. Summary  Syncope refers to a condition in which a person temporarily loses consciousness. It is caused by a sudden decrease in blood flow to the brain.  Signs that you may be about to faint include dizziness, feeling light-headed, feeling nauseous, sudden vision changes, or cold, clammy skin.  Although most causes of syncope are not dangerous, syncope can be a sign of a serious medical problem. If you faint, get medical help right away. This information is not intended to replace advice given to you by your health care provider. Make sure you discuss any questions you have with your health care provider. Document Revised: 02/13/2020 Document Reviewed: 02/13/2020 Elsevier Patient Education  2021 [employer]  

## 2020-11-19 NOTE — Progress Notes (Signed)
Subjective:    Patient ID: Shawn Russell., male    DOB: 04/29/1961, 60 y.o.   MRN: [id number]  Chief Complaint  Patient presents with  . Loss of Consciousness    Last Friday in his bathroom at his apartment him and his wife was in a heated argument. He told her he was leaving went to the bathroom and next thing he knew he was waking up to EMs and cops   Pt presents to the office today with a syncope episode that occurred on 11/13/20. He reports he got in an argument with his wife after he found she had been taking his medication. He is extremely anxious and stressed at this time as now she is not letting him in their apartment to get any of his stuff.  Loss of Consciousness This is a new problem. The current episode started in the past 7 days. He lost consciousness for a period of less than 1 minute. Associated symptoms include dizziness, malaise/fatigue and weakness. Pertinent negatives include no back pain, bladder incontinence, bowel incontinence or fever. He has tried bed rest for the symptoms. The treatment provided mild relief.      Review of Systems  Constitutional: Positive for malaise/fatigue. Negative for fever.  Cardiovascular: Positive for syncope.  Gastrointestinal: Negative for bowel incontinence.  Genitourinary: Negative for bladder incontinence.  Musculoskeletal: Negative for back pain.  Neurological: Positive for dizziness and weakness.  All other systems reviewed and are negative.      Objective:   Physical Exam Vitals reviewed.  Constitutional:      General: He is not in acute distress.    Appearance: He is well-developed and well-nourished.  HENT:     Head: Normocephalic.     Mouth/Throat:     Mouth: Oropharynx is clear and moist.  Eyes:     General:        Right eye: No discharge.        Left eye: No discharge.     Pupils: Pupils are equal, round, and reactive to light.  Neck:     Thyroid: No thyromegaly.  Cardiovascular:     Rate and Rhythm:  Normal rate and regular rhythm.     Pulses: Intact distal pulses.     Heart sounds: Normal heart sounds. No murmur heard.   Pulmonary:     Effort: Pulmonary effort is normal. No respiratory distress.     Breath sounds: Normal breath sounds. No wheezing.  Abdominal:     General: Bowel sounds are normal. There is no distension.     Palpations: Abdomen is soft.     Tenderness: There is no abdominal tenderness.  Musculoskeletal:        General: Tenderness (in left knee pain) present. No edema.     Cervical back: Normal range of motion and neck supple.  Neurological:     Mental Status: He is alert and oriented to person, place, and time.     Cranial Nerves: No cranial nerve deficit.     Deep Tendon Reflexes: Reflexes are normal and symmetric.  Psychiatric:        Mood and Affect: Mood and affect normal.        Behavior: Behavior normal.        Thought Content: Thought content normal.        Judgment: Judgment normal.       BP 126/76   Pulse 100   Temp (!) 97.3 F (36.3 C) (Temporal)   Ht 5'  11" (1.803 m)   Wt 195 lb (88.5 kg)   SpO2 100%   BMI 27.20 kg/m      Assessment & Plan:  Shawn Russell. comes in today with chief complaint of Loss of Consciousness (Last Friday in his bathroom at his apartment him and his wife was in a heated argument. He told her he was leaving went to the bathroom and next thing he knew he was waking up to EMs and cops)   Diagnosis and orders addressed:  1. Syncope, unspecified syncope type - EKG 12-Lead - CMP14+EGFR - Anemia Profile B - TSH - Ambulatory referral to Cardiology  2. GAD (generalized anxiety disorder) - EKG 12-Lead - CMP14+EGFR - Anemia Profile B - TSH - Ambulatory referral to Cardiology  EKG is not correct, I am not unsure if this is because of knee brace? Nurse reports she attempted 4 times.  Referral to Cardiologists to rule out arrhythmia Labs pending Stress management     Evelina Dun, FNP

## 2020-11-20 LAB — CMP14+EGFR
ALT: 18 IU/L (ref 0–44)
AST: 21 IU/L (ref 0–40)
Albumin/Globulin Ratio: 1.6 (ref 1.2–2.2)
Albumin: 4.6 g/dL (ref 3.8–4.9)
Alkaline Phosphatase: 90 IU/L (ref 44–121)
BUN/Creatinine Ratio: 17 (ref 10–24)
BUN: 17 mg/dL (ref 8–27)
Bilirubin Total: 0.3 mg/dL (ref 0.0–1.2)
CO2: 24 mmol/L (ref 20–29)
Calcium: 9.6 mg/dL (ref 8.6–10.2)
Chloride: 103 mmol/L (ref 96–106)
Creatinine, Ser: 0.98 mg/dL (ref 0.76–1.27)
GFR calc Af Amer: 96 mL/min/{1.73_m2} (ref >59)
GFR calc non Af Amer: 83 mL/min/{1.73_m2} (ref >59)
Globulin, Total: 2.9 g/dL (ref 1.5–4.5)
Glucose: 90 mg/dL (ref 65–99)
Potassium: 4.6 mmol/L (ref 3.5–5.2)
Sodium: 140 mmol/L (ref 134–144)
Total Protein: 7.5 g/dL (ref 6.0–8.5)

## 2020-11-20 LAB — ANEMIA PROFILE B
Basophils Absolute: 0 10*3/uL (ref 0.0–0.2)
Basos: 1 %
EOS (ABSOLUTE): 0.1 10*3/uL (ref 0.0–0.4)
Eos: 1 %
Ferritin: 231 ng/mL (ref 30–400)
Folate: 14 ng/mL (ref >3.0)
Hematocrit: 48.5 % (ref 37.5–51.0)
Hemoglobin: 16.6 g/dL (ref 13.0–17.7)
Immature Grans (Abs): 0 10*3/uL (ref 0.0–0.1)
Immature Granulocytes: 0 %
Iron Saturation: 31 % (ref 15–55)
Iron: 92 ug/dL (ref 38–169)
Lymphocytes Absolute: 0.7 10*3/uL (ref 0.7–3.1)
Lymphs: 13 %
MCH: 32.3 pg (ref 26.6–33.0)
MCHC: 34.2 g/dL (ref 31.5–35.7)
MCV: 94 fL (ref 79–97)
Monocytes Absolute: 0.5 10*3/uL (ref 0.1–0.9)
Monocytes: 10 %
Neutrophils Absolute: 4.3 10*3/uL (ref 1.4–7.0)
Neutrophils: 75 %
Platelets: 254 10*3/uL (ref 150–450)
RBC: 5.14 x10E6/uL (ref 4.14–5.80)
RDW: 13.1 % (ref 11.6–15.4)
Retic Ct Pct: 1.4 % (ref 0.6–2.6)
Total Iron Binding Capacity: 298 ug/dL (ref 250–450)
UIBC: 206 ug/dL (ref 111–343)
Vitamin B-12: 383 pg/mL (ref 232–1245)
WBC: 5.6 10*3/uL (ref 3.4–10.8)

## 2020-11-20 LAB — TSH: TSH: 0.818 u[IU]/mL (ref 0.450–4.500)

## 2020-12-02 DIAGNOSIS — X58XXXA Exposure to other specified factors, initial encounter: Secondary | ICD-10-CM | POA: Diagnosis not present

## 2020-12-02 DIAGNOSIS — S83282A Other tear of lateral meniscus, current injury, left knee, initial encounter: Secondary | ICD-10-CM | POA: Diagnosis not present

## 2020-12-02 DIAGNOSIS — M8548 Solitary bone cyst, other site: Secondary | ICD-10-CM | POA: Diagnosis not present

## 2020-12-02 DIAGNOSIS — S83512A Sprain of anterior cruciate ligament of left knee, initial encounter: Secondary | ICD-10-CM | POA: Diagnosis not present

## 2020-12-02 DIAGNOSIS — M25462 Effusion, left knee: Secondary | ICD-10-CM | POA: Diagnosis not present

## 2020-12-02 DIAGNOSIS — M7122 Synovial cyst of popliteal space [Baker], left knee: Secondary | ICD-10-CM | POA: Diagnosis not present

## 2020-12-02 DIAGNOSIS — Z9889 Other specified postprocedural states: Secondary | ICD-10-CM | POA: Diagnosis not present

## 2020-12-02 DIAGNOSIS — S83289A Other tear of lateral meniscus, current injury, unspecified knee, initial encounter: Secondary | ICD-10-CM | POA: Diagnosis not present

## 2020-12-02 DIAGNOSIS — M1712 Unilateral primary osteoarthritis, left knee: Secondary | ICD-10-CM | POA: Diagnosis not present

## 2020-12-03 DIAGNOSIS — Z6826 Body mass index (BMI) 26.0-26.9, adult: Secondary | ICD-10-CM | POA: Diagnosis not present

## 2020-12-03 DIAGNOSIS — M1712 Unilateral primary osteoarthritis, left knee: Secondary | ICD-10-CM | POA: Diagnosis not present

## 2020-12-15 NOTE — Progress Notes (Deleted)
Cardiology Office Note   Date:  12/15/2020   ID:  Shawn Hamburger., DOB Aug 30, 1961, MRN [id number]  PCP:  Dettinger, Fransisca Kaufmann, MD  Cardiologist:   No primary care provider on file. Referring:  ***  No chief complaint on file.     History of Present Illness: Shawn Kai. is a 60 y.o. male who was referred by *** for evaluation of syncope.  ***   Past Medical History:  Diagnosis Date  . Chronic back pain   . Chronic constipation   . Chronic hepatitis C without hepatic coma ([place])    blood transfusion 1988;   per pt was treated with mediation approx. 2010 was followed by liver clinic @ [facility] and has been cured  . COPD (chronic obstructive pulmonary disease) ([place])   . Degenerative disc disease, lumbar   . Depression   . ED (erectile dysfunction)   . GERD (gastroesophageal reflux disease)   . Hearing loss of both ears    per pt has hearing aids but does not wear  . History of esophageal dilatation 2014  . History of hypogonadism   . History of kidney stones   . History of TIAs    per pt approx. 2004  . OA (osteoarthritis)    knees  . Renal cyst, left   . Spinal stenosis     Past Surgical History:  Procedure Laterality Date  . ANKLE SURGERY Right 1990   ORIF right ankle fracture;  retained hardware  . COLONOSCOPY WITH PROPOFOL N/A 02/14/2013   Procedure: COLONOSCOPY WITH PROPOFOL;  Surgeon: Daneil Dolin, MD;  Location: [facility];  Service: Endoscopy;  Laterality: N/A;  At cecum:09:16,  cecal withdrawal time:0930, total cecal time:14 minutes  . ESOPHAGOGASTRODUODENOSCOPY (EGD) WITH PROPOFOL N/A 02/14/2013   Procedure: ESOPHAGOGASTRODUODENOSCOPY (EGD) WITH PROPOFOL;  Surgeon: Daneil Dolin, MD;  Location: [facility];  Service: Endoscopy;  Laterality: N/A;  . kidney stone extraction  1989   ureteroscopy stone extraction  . KNEE SURGERY Left 1995   Reconstruction w/ retained hardware  . MALONEY DILATION N/A 02/14/2013   Procedure: Venia Minks DILATION;  Surgeon: Daneil Dolin, MD;  Location: [facility];  Service: Endoscopy;  Laterality: N/A;  . POSTERIOR LUMBAR FUSION  10-04-2004   dr Lenna Sciara. Arnoldo Morale @[facility]    laminectomy and fusion L3 -- L5  . POSTERIOR LUMBAR FUSION  06-26-2006   dr Lenna Sciara. Arnoldo Morale  @[facility]    Re-Do laminectomy L3,  fusion L2 -- L4  . ROTATOR CUFF REPAIR Right 2000  . SPERMATOCELECTOMY Right 12/13/2018   Procedure: SPERMATOCELECTOMY;  Surgeon: Irine Seal, MD;  Location: [facility];  Service: Urology;  Laterality: Right;  . surgery on ears Bilateral x3   as child     Current Outpatient Medications  Medication Sig Dispense Refill  . aspirin 325 MG tablet Take 325 mg by mouth daily.    Marland Kitchen HYDROmorphone (DILAUDID) 4 MG tablet Take 4 mg by mouth every 4 (four) hours.    Marland Kitchen linaclotide (LINZESS) 290 MCG CAPS capsule Take 1 capsule (290 mcg total) by mouth daily before breakfast. (Patient not taking: Reported on 11/19/2020) 30 capsule 5  . venlafaxine XR (EFFEXOR-XR) 37.5 MG 24 hr capsule Take 37.5 mg by mouth 2 (two) times daily.     No current facility-administered medications for this visit.    Allergies:   Patient has no known allergies.    Social History:  The patient  reports that he has been smoking cigarettes. He has  a 40.00 pack-year smoking history. He has never used smokeless tobacco. He reports that he does not drink alcohol and does not use drugs.   Family History:  The patient's ***family history includes Heart disease in his father and mother; Osteoarthritis in his mother.    ROS:  Please see the history of present illness.   Otherwise, review of systems are positive for {NONE DEFAULTED:18576::"none"}.   All other systems are reviewed and negative.    PHYSICAL EXAM: VS:  There were no vitals taken for this visit. , BMI There is no height or weight on file to calculate BMI. GENERAL:  Well appearing HEENT:  Pupils equal round and reactive, fundi not visualized, oral mucosa unremarkable NECK:  No jugular venous distention, waveform within normal  limits, carotid upstroke brisk and symmetric, no bruits, no thyromegaly LYMPHATICS:  No cervical, inguinal adenopathy LUNGS:  Clear to auscultation bilaterally BACK:  No CVA tenderness CHEST:  Unremarkable HEART:  PMI not displaced or sustained,S1 and S2 within normal limits, no S3, no S4, no clicks, no rubs, *** murmurs ABD:  Flat, positive bowel sounds normal in frequency in pitch, no bruits, no rebound, no guarding, no midline pulsatile mass, no hepatomegaly, no splenomegaly EXT:  2 plus pulses throughout, no edema, no cyanosis no clubbing SKIN:  No rashes no nodules NEURO:  Cranial nerves II through XII grossly intact, motor grossly intact throughout PSYCH:  Cognitively intact, oriented to person place and time    EKG:  EKG {ACTION; IS/IS IRJ:18841660} ordered today. The ekg ordered today demonstrates ***   Recent Labs: 11/19/2020: ALT 18; BUN 17; Creatinine, Ser 0.98; Hemoglobin 16.6; Platelets 254; Potassium 4.6; Sodium 140; TSH 0.818    Lipid Panel    Component Value Date/Time   CHOL 216 (H) 01/13/2020 1547   TRIG 65 01/13/2020 1547   HDL 69 01/13/2020 1547   CHOLHDL 3.1 01/13/2020 1547   LDLCALC 136 (H) 01/13/2020 1547      Wt Readings from Last 3 Encounters:  11/19/20 195 lb (88.5 kg)  01/13/20 200 lb (90.7 kg)  10/15/19 200 lb (90.7 kg)      Other studies Reviewed: Additional studies/ records that were reviewed today include: ***. Review of the above records demonstrates:  Please see elsewhere in the note.  ***   ASSESSMENT AND PLAN:  SYNCOPE:  ***   Current medicines are reviewed at length with the patient today.  The patient {ACTIONS; HAS/DOES NOT HAVE:19233} concerns regarding medicines.  The following changes have been made:  {PLAN; NO CHANGE:13088:s}  Labs/ tests ordered today include: *** No orders of the defined types were placed in this encounter.    Disposition:   FU with ***    Signed, Minus Breeding, MD  12/15/2020 9:26 PM    [facility]

## 2020-12-16 ENCOUNTER — Ambulatory Visit: Payer: PPO | Admitting: Cardiology

## 2020-12-17 DIAGNOSIS — G894 Chronic pain syndrome: Secondary | ICD-10-CM | POA: Diagnosis not present

## 2020-12-17 DIAGNOSIS — M961 Postlaminectomy syndrome, not elsewhere classified: Secondary | ICD-10-CM | POA: Diagnosis not present

## 2020-12-17 DIAGNOSIS — Z79891 Long term (current) use of opiate analgesic: Secondary | ICD-10-CM | POA: Diagnosis not present

## 2020-12-17 DIAGNOSIS — M47812 Spondylosis without myelopathy or radiculopathy, cervical region: Secondary | ICD-10-CM | POA: Diagnosis not present

## 2020-12-21 DIAGNOSIS — H40033 Anatomical narrow angle, bilateral: Secondary | ICD-10-CM | POA: Diagnosis not present

## 2020-12-21 DIAGNOSIS — H2513 Age-related nuclear cataract, bilateral: Secondary | ICD-10-CM | POA: Diagnosis not present

## 2021-01-25 ENCOUNTER — Ambulatory Visit: Payer: PPO | Admitting: Family Medicine

## 2021-02-11 DIAGNOSIS — G894 Chronic pain syndrome: Secondary | ICD-10-CM | POA: Diagnosis not present

## 2021-02-11 DIAGNOSIS — M47812 Spondylosis without myelopathy or radiculopathy, cervical region: Secondary | ICD-10-CM | POA: Diagnosis not present

## 2021-02-11 DIAGNOSIS — M961 Postlaminectomy syndrome, not elsewhere classified: Secondary | ICD-10-CM | POA: Diagnosis not present

## 2021-02-11 DIAGNOSIS — Z79891 Long term (current) use of opiate analgesic: Secondary | ICD-10-CM | POA: Diagnosis not present

## 2021-03-11 DIAGNOSIS — M47812 Spondylosis without myelopathy or radiculopathy, cervical region: Secondary | ICD-10-CM | POA: Diagnosis not present

## 2021-03-11 DIAGNOSIS — G894 Chronic pain syndrome: Secondary | ICD-10-CM | POA: Diagnosis not present

## 2021-03-11 DIAGNOSIS — Z79891 Long term (current) use of opiate analgesic: Secondary | ICD-10-CM | POA: Diagnosis not present

## 2021-03-11 DIAGNOSIS — M961 Postlaminectomy syndrome, not elsewhere classified: Secondary | ICD-10-CM | POA: Diagnosis not present

## 2021-03-24 ENCOUNTER — Ambulatory Visit (INDEPENDENT_AMBULATORY_CARE_PROVIDER_SITE_OTHER): Payer: PPO

## 2021-03-24 ENCOUNTER — Ambulatory Visit: Payer: PPO | Admitting: Orthopaedic Surgery

## 2021-03-24 VITALS — Ht 71.0 in | Wt 191.0 lb

## 2021-03-24 DIAGNOSIS — M25562 Pain in left knee: Secondary | ICD-10-CM

## 2021-03-24 DIAGNOSIS — G8929 Other chronic pain: Secondary | ICD-10-CM | POA: Diagnosis not present

## 2021-03-24 NOTE — Progress Notes (Signed)
Office Visit Note   Patient: Shawn Russell.           Date of Birth: 1961/06/11           MRN: [id number] Visit Date: 03/24/2021              Requested by: Dettinger, Fransisca Kaufmann, MD [place],   [id number] PCP: Dettinger, Fransisca Kaufmann, MD   Assessment & Plan: Visit Diagnoses:  1. Chronic pain of left knee     Plan: I spoke to the patient in length about his situation.  He does have severe arthritis in his left knee and a knee replacement would be recommended for this.  However, this is the most painful surgery that we perform and he is already on Dilaudid 4 mg twice daily for chronic pain.  I am not sure how to manage his pain after surgery and what pain management would recommend for postoperative pain relief.  This is quite concerning to me and I expressed that in detail to the patient.  He understands this as well.  He sees his pain management specialist next month so I will see him back in 6 weeks and see if we can come up with a good treatment plan for him in terms of postoperative pain control if we end up recommending knee replacement surgery.  He agrees with this treatment plan.  All question concerns were answered and addressed.  Follow-Up Instructions: Return in about 6 weeks (around 05/05/2021).   Orders:  Orders Placed This Encounter  Procedures  . XR Knee 1-2 Views Left   No orders of the defined types were placed in this encounter.     Procedures: No procedures performed   Clinical Data: No additional findings.   Subjective: Chief Complaint  Patient presents with  . Left Knee - Pain  The patient is referred from pain management to evaluate and treat severe left knee pain.  He is in chronic pain management due to multiple operations on his left knee which release 5 and multiple spine operations.  He said he has had a ligamentous reconstruction at least twice on his left knee and his pain is daily and is detrimentally affecting his activities day  living, his quality of life and his mobility to the point he wishes to have something done for that left knee.  He is on Dilaudid orally for his pain management.  HPI  Review of Systems There is currently listed no headache, chest pain, shortness of breath, fever, chills, nausea, vomiting  Objective: Vital Signs: Ht 5\' 11"  (1.803 m)   Wt 191 lb (86.6 kg)   BMI 26.64 kg/m   Physical Exam He is alert and oriented x3 and in no acute distress Ortho Exam Examination of his left knee shows well-healed surgical incision.  There is some knee swelling but no significant effusion.  This is more chronic bone type swelling.  There is medial and lateral joint line tenderness and slight varus malalignment.  There is some patellofemoral crepitation as well. Specialty Comments:  No specialty comments available.  Imaging: XR Knee 1-2 Views Left  Result Date: 03/24/2021 2 views left knee show severe end-stage arthritis of the left knee with complete loss of the joint space with all 3 compartments.  There are osteophytes in all 3 compartments and retained screw from ligamentous reconstruction.  There is screw is an interference screw in the femur.  There is also varus malalignment.    [place]  History: Patient Active Problem List   Diagnosis Date Noted  . Hydrocele 06/10/2015  . Yeast dermatitis of penis 06/10/2015  . Genital warts 06/10/2015  . Right upper quadrant pain 02/08/2013  . Dysphagia, pharyngoesophageal phase 02/08/2013  . Anxiety and depression 01/11/2011  . Hepatitis C 01/11/2011  . Sciatica 01/11/2011  . Degeneration of intervertebral disc 01/11/2011  . Dysthymic disorder 01/11/2011   Past Medical History:  Diagnosis Date  . Chronic back pain   . Chronic constipation   . Chronic hepatitis C without hepatic coma ([place])    blood transfusion 1988;   per pt was treated with mediation approx. 2010 was followed by liver clinic @ [facility] and has been cured  . COPD (chronic obstructive  pulmonary disease) ([place])   . Degenerative disc disease, lumbar   . Depression   . ED (erectile dysfunction)   . GERD (gastroesophageal reflux disease)   . Hearing loss of both ears    per pt has hearing aids but does not wear  . History of esophageal dilatation 2014  . History of hypogonadism   . History of kidney stones   . History of TIAs    per pt approx. 2004  . OA (osteoarthritis)    knees  . Renal cyst, left   . Spinal stenosis     Family History  Problem Relation Age of Onset  . Heart disease Mother   . Osteoarthritis Mother   . Heart disease Father   . Colon cancer Neg Hx   . Liver disease Neg Hx     Past Surgical History:  Procedure Laterality Date  . ANKLE SURGERY Right 1990   ORIF right ankle fracture;  retained hardware  . COLONOSCOPY WITH PROPOFOL N/A 02/14/2013   Procedure: COLONOSCOPY WITH PROPOFOL;  Surgeon: Daneil Dolin, MD;  Location: [facility];  Service: Endoscopy;  Laterality: N/A;  At cecum:09:16,  cecal withdrawal time:0930, total cecal time:14 minutes  . ESOPHAGOGASTRODUODENOSCOPY (EGD) WITH PROPOFOL N/A 02/14/2013   Procedure: ESOPHAGOGASTRODUODENOSCOPY (EGD) WITH PROPOFOL;  Surgeon: Daneil Dolin, MD;  Location: [facility];  Service: Endoscopy;  Laterality: N/A;  . kidney stone extraction  1989   ureteroscopy stone extraction  . KNEE SURGERY Left 1995   Reconstruction w/ retained hardware  . MALONEY DILATION N/A 02/14/2013   Procedure: Venia Minks DILATION;  Surgeon: Daneil Dolin, MD;  Location: [facility];  Service: Endoscopy;  Laterality: N/A;  . POSTERIOR LUMBAR FUSION  10-04-2004   dr Lenna Sciara. Arnoldo Morale @[facility]    laminectomy and fusion L3 -- L5  . POSTERIOR LUMBAR FUSION  06-26-2006   dr Lenna Sciara. Arnoldo Morale  @[facility]    Re-Do laminectomy L3,  fusion L2 -- L4  . ROTATOR CUFF REPAIR Right 2000  . SPERMATOCELECTOMY Right 12/13/2018   Procedure: SPERMATOCELECTOMY;  Surgeon: Irine Seal, MD;  Location: [facility];  Service: Urology;  Laterality: Right;  . surgery on ears Bilateral x3   as  child   Social History   Occupational History  . Not on file  Tobacco Use  . Smoking status: Current Every Day Smoker    Packs/day: 1.00    Years: 40.00    Pack years: 40.00    Types: Cigarettes  . Smokeless tobacco: Never Used  Vaping Use  . Vaping Use: Never used  Substance and Sexual Activity  . Alcohol use: No  . Drug use: No  . Sexual activity: Not Currently

## 2021-04-26 DIAGNOSIS — G894 Chronic pain syndrome: Secondary | ICD-10-CM | POA: Diagnosis not present

## 2021-04-26 DIAGNOSIS — Z79891 Long term (current) use of opiate analgesic: Secondary | ICD-10-CM | POA: Diagnosis not present

## 2021-04-26 DIAGNOSIS — M47812 Spondylosis without myelopathy or radiculopathy, cervical region: Secondary | ICD-10-CM | POA: Diagnosis not present

## 2021-04-26 DIAGNOSIS — M961 Postlaminectomy syndrome, not elsewhere classified: Secondary | ICD-10-CM | POA: Diagnosis not present

## 2021-05-05 ENCOUNTER — Ambulatory Visit: Payer: PPO | Admitting: Orthopaedic Surgery

## 2021-06-14 DIAGNOSIS — M47812 Spondylosis without myelopathy or radiculopathy, cervical region: Secondary | ICD-10-CM | POA: Diagnosis not present

## 2021-06-14 DIAGNOSIS — G894 Chronic pain syndrome: Secondary | ICD-10-CM | POA: Diagnosis not present

## 2021-06-14 DIAGNOSIS — M961 Postlaminectomy syndrome, not elsewhere classified: Secondary | ICD-10-CM | POA: Diagnosis not present

## 2021-06-14 DIAGNOSIS — Z79891 Long term (current) use of opiate analgesic: Secondary | ICD-10-CM | POA: Diagnosis not present

## 2021-08-09 DIAGNOSIS — Z79891 Long term (current) use of opiate analgesic: Secondary | ICD-10-CM | POA: Diagnosis not present

## 2021-08-09 DIAGNOSIS — M961 Postlaminectomy syndrome, not elsewhere classified: Secondary | ICD-10-CM | POA: Diagnosis not present

## 2021-08-09 DIAGNOSIS — G894 Chronic pain syndrome: Secondary | ICD-10-CM | POA: Diagnosis not present

## 2021-08-09 DIAGNOSIS — M47812 Spondylosis without myelopathy or radiculopathy, cervical region: Secondary | ICD-10-CM | POA: Diagnosis not present

## 2021-08-19 ENCOUNTER — Encounter: Payer: Self-pay | Admitting: Family Medicine

## 2021-08-19 ENCOUNTER — Ambulatory Visit (INDEPENDENT_AMBULATORY_CARE_PROVIDER_SITE_OTHER): Payer: PPO | Admitting: Family Medicine

## 2021-08-19 DIAGNOSIS — J40 Bronchitis, not specified as acute or chronic: Secondary | ICD-10-CM | POA: Diagnosis not present

## 2021-08-19 DIAGNOSIS — J069 Acute upper respiratory infection, unspecified: Secondary | ICD-10-CM | POA: Diagnosis not present

## 2021-08-19 MED ORDER — PREDNISONE 20 MG PO TABS: 40.0000 mg | ORAL_TABLET | Freq: Every day | ORAL | 0 refills | Status: AC

## 2021-08-19 MED ORDER — ALBUTEROL SULFATE HFA 108 (90 BASE) MCG/ACT IN AERS: 2.0000 | INHALATION_SPRAY | Freq: Four times a day (QID) | RESPIRATORY_TRACT | 2 refills | Status: DC | PRN

## 2021-08-19 MED ORDER — AMOXICILLIN-POT CLAVULANATE 875-125 MG PO TABS: 1.0000 | ORAL_TABLET | Freq: Two times a day (BID) | ORAL | 0 refills | Status: AC

## 2021-08-19 NOTE — Progress Notes (Signed)
Virtual Visit via telephone Note Due to COVID-19 pandemic this visit was conducted virtually. This visit type was conducted due to national recommendations for restrictions regarding the COVID-19 Pandemic (e.g. social distancing, sheltering in place) in an effort to limit this patient's exposure and mitigate transmission in our community. All issues noted in this document were discussed and addressed.  A physical exam was not performed with this format.   I connected with Shawn Russell. on 08/19/2021 at 0900 by telephone and verified that I am speaking with the correct person using two identifiers. Shawn Russell. is currently located at home and  no one  is currently with them during visit. The provider, Monia Pouch, FNP is located in their office at time of visit.  I discussed the limitations, risks, security and privacy concerns of performing an evaluation and management service by telephone and the availability of in person appointments. I also discussed with the patient that there may be a patient responsible charge related to this service. The patient expressed understanding and agreed to proceed.  Subjective:  Patient ID: Shawn Russell., male    DOB: November 11, 1960, 60 y.o.   MRN: [id number]  Chief Complaint:  URI   HPI: Lyndal Reggio. is a 60 y.o. male presenting on 08/19/2021 for URI   Pt reports ongoing cough, congestion, sputum production, and malaise for over  weeks. He has been using Mucinex with minimal relief of symptoms.   URI  This is a new problem. The current episode started 1 to 4 weeks ago. The problem has been gradually worsening. Associated symptoms include congestion, coughing, rhinorrhea and wheezing. Pertinent negatives include no abdominal pain, chest pain, diarrhea, dysuria, ear pain, headaches, joint pain, joint swelling, nausea, neck pain, plugged ear sensation, rash, sinus pain, sneezing, sore throat, swollen glands or vomiting. He has tried  decongestant for the symptoms. The treatment provided mild relief.  Cough This is a new problem. The current episode started 1 to 4 weeks ago. The problem has been gradually worsening. The problem occurs every few minutes. The cough is Productive of purulent sputum. Associated symptoms include chills, nasal congestion, postnasal drip, rhinorrhea, shortness of breath and wheezing. Pertinent negatives include no chest pain, ear congestion, ear pain, fever, headaches, heartburn, hemoptysis, myalgias, rash, sore throat, sweats or weight loss. He has tried OTC cough suppressant for the symptoms. The treatment provided no relief.    Relevant past medical, surgical, family, and social history reviewed and updated as indicated.  Allergies and medications reviewed and updated.   Past Medical History:  Diagnosis Date   Chronic back pain    Chronic constipation    Chronic hepatitis C without hepatic coma ([place])    blood transfusion 1988;   per pt was treated with mediation approx. 2010 was followed by liver clinic @ [facility] and has been cured   COPD (chronic obstructive pulmonary disease) ([place])    Degenerative disc disease, lumbar    Depression    ED (erectile dysfunction)    GERD (gastroesophageal reflux disease)    Hearing loss of both ears    per pt has hearing aids but does not wear   History of esophageal dilatation 2014   History of hypogonadism    History of kidney stones    History of TIAs    per pt approx. 2004   OA (osteoarthritis)    knees   Renal cyst, left    Spinal stenosis     Past  Surgical History:  Procedure Laterality Date   ANKLE SURGERY Right 1990   ORIF right ankle fracture;  retained hardware   COLONOSCOPY WITH PROPOFOL N/A 02/14/2013   Procedure: COLONOSCOPY WITH PROPOFOL;  Surgeon: Daneil Dolin, MD;  Location: [facility];  Service: Endoscopy;  Laterality: N/A;  At cecum:09:16,  cecal withdrawal time:0930, total cecal time:14 minutes   ESOPHAGOGASTRODUODENOSCOPY (EGD)  WITH PROPOFOL N/A 02/14/2013   Procedure: ESOPHAGOGASTRODUODENOSCOPY (EGD) WITH PROPOFOL;  Surgeon: Daneil Dolin, MD;  Location: [facility];  Service: Endoscopy;  Laterality: N/A;   kidney stone extraction  1989   ureteroscopy stone extraction   KNEE SURGERY Left 1995   Reconstruction w/ retained hardware   MALONEY DILATION N/A 02/14/2013   Procedure: Venia Minks DILATION;  Surgeon: Daneil Dolin, MD;  Location: [facility];  Service: Endoscopy;  Laterality: N/A;   POSTERIOR LUMBAR FUSION  10-04-2004   dr Lenna Sciara. Arnoldo Morale @[facility]    laminectomy and fusion L3 -- L5   POSTERIOR LUMBAR FUSION  06-26-2006   dr Lenna Sciara. Arnoldo Morale  @[facility]    Re-Do laminectomy L3,  fusion L2 -- L4   ROTATOR CUFF REPAIR Right 2000   SPERMATOCELECTOMY Right 12/13/2018   Procedure: SPERMATOCELECTOMY;  Surgeon: Irine Seal, MD;  Location: [facility];  Service: Urology;  Laterality: Right;   surgery on ears Bilateral x3   as child    Social History   Socioeconomic History   Marital status: Married    Spouse name: Not on file   Number of children: Not on file   Years of education: Not on file   Highest education level: Not on file  Occupational History   Not on file  Tobacco Use   Smoking status: Every Day    Packs/day: 1.00    Years: 40.00    Pack years: 40.00    Types: Cigarettes   Smokeless tobacco: Never  Vaping Use   Vaping Use: Never used  Substance and Sexual Activity   Alcohol use: No   Drug use: No   Sexual activity: Not Currently  Other Topics Concern   Not on file  Social History Narrative   Not on file   Social Determinants of Health   Financial Resource Strain: Not on file  Food Insecurity: Not on file  Transportation Needs: Not on file  Physical Activity: Not on file  Stress: Not on file  Social Connections: Not on file  Intimate Partner Violence: Not on file    Outpatient Encounter Medications as of 08/19/2021  Medication Sig   albuterol (VENTOLIN HFA) 108 (90 Base) MCG/ACT inhaler Inhale 2 puffs into the lungs  every 6 (six) hours as needed for wheezing or shortness of breath.   amoxicillin-clavulanate (AUGMENTIN) 875-125 MG tablet Take 1 tablet by mouth 2 (two) times daily for 10 days.   predniSONE (DELTASONE) 20 MG tablet Take 2 tablets (40 mg total) by mouth daily with breakfast for 5 days.   aspirin 325 MG tablet Take 325 mg by mouth daily.   HYDROmorphone (DILAUDID) 4 MG tablet Take 4 mg by mouth every 4 (four) hours.   linaclotide (LINZESS) 290 MCG CAPS capsule Take 1 capsule (290 mcg total) by mouth daily before breakfast. (Patient not taking: Reported on 11/19/2020)   venlafaxine XR (EFFEXOR-XR) 37.5 MG 24 hr capsule Take 37.5 mg by mouth 2 (two) times daily.   No facility-administered encounter medications on file as of 08/19/2021.    No Known Allergies  Review of Systems  Constitutional:  Positive for activity change, chills  and fatigue. Negative for appetite change, diaphoresis, fever, unexpected weight change and weight loss.  HENT:  Positive for congestion, postnasal drip and rhinorrhea. Negative for ear pain, sinus pain, sneezing and sore throat.   Respiratory:  Positive for cough, shortness of breath and wheezing. Negative for hemoptysis.   Cardiovascular:  Negative for chest pain.  Gastrointestinal:  Negative for abdominal pain, diarrhea, heartburn, nausea and vomiting.  Genitourinary:  Negative for decreased urine volume, difficulty urinating and dysuria.  Musculoskeletal:  Negative for joint pain, myalgias and neck pain.  Skin:  Negative for rash.  Neurological:  Negative for dizziness, tremors, seizures, syncope, facial asymmetry, speech difficulty, weakness, light-headedness, numbness and headaches.  Psychiatric/Behavioral:  Negative for confusion.   All other systems reviewed and are negative.       Observations/Objective: No vital signs or physical exam, this was a telephone or virtual health encounter.  Pt alert and oriented, answers all questions appropriately, and able  to speak in full sentences.    Assessment and Plan: Tykwon was seen today for uri.  Diagnoses and all orders for this visit:  URI with cough and congestion Bronchitis Symptoms consistent with bacterial URI and bronchitis. Has failed symptomatic care at home. Will initiate Augmentin along with steroid burst and Albuterol inhaler. Pt aware to continue Mucinex with plenty of water at home. Report any new, worsening, or persistent symptoms.  -     amoxicillin-clavulanate (AUGMENTIN) 875-125 MG tablet; Take 1 tablet by mouth 2 (two) times daily for 10 days. -     albuterol (VENTOLIN HFA) 108 (90 Base) MCG/ACT inhaler; Inhale 2 puffs into the lungs every 6 (six) hours as needed for wheezing or shortness of breath. -     predniSONE (DELTASONE) 20 MG tablet; Take 2 tablets (40 mg total) by mouth daily with breakfast for 5 days.     Follow Up Instructions: Return if symptoms worsen or fail to improve.    I discussed the assessment and treatment plan with the patient. The patient was provided an opportunity to ask questions and all were answered. The patient agreed with the plan and demonstrated an understanding of the instructions.   The patient was advised to call back or seek an in-person evaluation if the symptoms worsen or if the condition fails to improve as anticipated.  The above assessment and management plan was discussed with the patient. The patient verbalized understanding of and has agreed to the management plan. Patient is aware to call the clinic if they develop any new symptoms or if symptoms persist or worsen. Patient is aware when to return to the clinic for a follow-up visit. Patient educated on when it is appropriate to go to the emergency department.    I provided 12 minutes of non-face-to-face time during this encounter. The call started at 0900. The call ended at 0910. The other time was used for coordination of care.    Monia Pouch, FNP-C [facility] [address] [place], [place] [address] [phone] 08/19/2021  ,

## 2021-09-16 ENCOUNTER — Other Ambulatory Visit: Payer: Self-pay

## 2021-09-16 ENCOUNTER — Ambulatory Visit (INDEPENDENT_AMBULATORY_CARE_PROVIDER_SITE_OTHER): Payer: PPO | Admitting: Family Medicine

## 2021-09-16 ENCOUNTER — Encounter: Payer: Self-pay | Admitting: Family Medicine

## 2021-09-16 VITALS — BP 128/79 | HR 67 | Ht 71.0 in | Wt 186.0 lb

## 2021-09-16 DIAGNOSIS — Z0001 Encounter for general adult medical examination with abnormal findings: Secondary | ICD-10-CM

## 2021-09-16 DIAGNOSIS — Z0000 Encounter for general adult medical examination without abnormal findings: Secondary | ICD-10-CM | POA: Diagnosis not present

## 2021-09-16 DIAGNOSIS — F341 Dysthymic disorder: Secondary | ICD-10-CM

## 2021-09-16 NOTE — Progress Notes (Signed)
BP 128/79   Pulse 67   Ht _0  (1.803 m)   Wt 186 lb (84.4 kg)   SpO2 100%   BMI 25.94 kg/m    Subjective:   Patient ID: Shawn Russell., male    DOB: 10/02/1961, 60 y.o.   MRN: [id number]  HPI: Shawn Russell. is a 60 y.o. male presenting on 09/16/2021 for Medical Management of Chronic Issues   HPI Adult well exam Patient coming in today for adult well exam and physical. Annual weight that he has he says is just run down his energy is down he does not know if that is due to chronic pain meds or something else.  He would like to have it checked further. Patient denies any chest pain, shortness of breath, headaches or vision issues, abdominal complaints, diarrhea, nausea, vomiting, or joint issues.  He does still smoke and he sees chronic pain management and is on hydromorphone  Relevant past medical, surgical, family and social history reviewed and updated as indicated. Interim medical history since our last visit reviewed. Allergies and medications reviewed and updated.  Review of Systems  Constitutional:  Negative for chills and fever.  HENT:  Negative for ear pain and tinnitus.   Eyes:  Negative for pain and discharge.  Respiratory:  Negative for cough, shortness of breath and wheezing.   Cardiovascular:  Negative for chest pain, palpitations and leg swelling.  Gastrointestinal:  Negative for abdominal pain, blood in stool, constipation and diarrhea.  Genitourinary:  Negative for dysuria and hematuria.  Musculoskeletal:  Positive for arthralgias and back pain. Negative for gait problem and myalgias.  Skin:  Negative for rash.  Neurological:  Negative for dizziness, weakness and headaches.  Psychiatric/Behavioral:  Negative for suicidal ideas.   All other systems reviewed and are negative.  Per HPI unless specifically indicated above   Allergies as of 09/16/2021   No Known Allergies      Medication List        Accurate as of September 16, 2021 11:17 AM. If  you have any questions, ask your nurse or doctor.          STOP taking these medications    albuterol 108 (90 Base) MCG/ACT inhaler Commonly known as: VENTOLIN HFA Stopped by: Fransisca Kaufmann Opel Lejeune, MD   linaclotide 290 MCG Caps capsule Commonly known as: Linzess Stopped by: Fransisca Kaufmann Gwynn Chalker, MD   venlafaxine XR 37.5 MG 24 hr capsule Commonly known as: EFFEXOR-XR Stopped by: Fransisca Kaufmann Vaniya Augspurger, MD       TAKE these medications    aspirin 325 MG tablet Take 325 mg by mouth daily.   HYDROmorphone 4 MG tablet Commonly known as: DILAUDID Take 4 mg by mouth every 4 (four) hours.         Objective:   BP 128/79   Pulse 67   Ht _1  (1.803 m)   Wt 186 lb (84.4 kg)   SpO2 100%   BMI 25.94 kg/m   Wt Readings from Last 3 Encounters:  09/16/21 186 lb (84.4 kg)  03/24/21 191 lb (86.6 kg)  11/19/20 195 lb (88.5 kg)    Physical Exam Vitals and nursing note reviewed.  Constitutional:      General: He is not in acute distress.    Appearance: He is well-developed. He is not diaphoretic.  HENT:     Right Ear: External ear normal.     Left Ear: External ear normal.     Nose: Nose  normal.     Mouth/Throat:     Pharynx: No oropharyngeal exudate.  Eyes:     General: No scleral icterus.    Conjunctiva/sclera: Conjunctivae normal.  Neck:     Thyroid: No thyromegaly.  Cardiovascular:     Rate and Rhythm: Normal rate and regular rhythm.     Heart sounds: Normal heart sounds. No murmur heard. Pulmonary:     Effort: Pulmonary effort is normal. No respiratory distress.     Breath sounds: Normal breath sounds. No wheezing.  Abdominal:     General: Bowel sounds are normal. There is no distension.     Palpations: Abdomen is soft.     Tenderness: There is no abdominal tenderness. There is no guarding or rebound.  Musculoskeletal:        General: Normal range of motion.     Cervical back: Neck supple.  Lymphadenopathy:     Cervical: No cervical adenopathy.  Skin:     General: Skin is warm and dry.     Findings: No rash.  Neurological:     Mental Status: He is alert and oriented to person, place, and time.     Coordination: Coordination normal.  Psychiatric:        Behavior: Behavior normal.      Assessment & Plan:   Problem List Items Addressed This Visit       Other   Dysthymic disorder   Relevant Orders   CBC with Differential/Platelet   CMP14+EGFR   Lipid panel   TSH   Other Visit Diagnoses     Physical exam    -  Primary   Relevant Orders   CBC with Differential/Platelet   CMP14+EGFR   Lipid panel   TSH       Continue current medicine.  Due to fatigue and decreased energy, will add on a thyroid and some blood counts Follow up plan: Return in about 1 year (around 09/16/2022), or if symptoms worsen or fail to improve, for Physical exam.  Counseling provided for all of the vaccine components Orders Placed This Encounter  Procedures   CBC with Differential/Platelet   CMP14+EGFR   Lipid panel   TSH    [name], MD [facility] 09/16/2021, 11:17 AM

## 2021-09-17 LAB — CBC WITH DIFFERENTIAL/PLATELET
Basophils Absolute: 0 10*3/uL (ref 0.0–0.2)
Basos: 1 %
EOS (ABSOLUTE): 0.1 10*3/uL (ref 0.0–0.4)
Eos: 1 %
Hematocrit: 46.2 % (ref 37.5–51.0)
Hemoglobin: 15.6 g/dL (ref 13.0–17.7)
Immature Grans (Abs): 0 10*3/uL (ref 0.0–0.1)
Immature Granulocytes: 0 %
Lymphocytes Absolute: 1.5 10*3/uL (ref 0.7–3.1)
Lymphs: 26 %
MCH: 32.4 pg (ref 26.6–33.0)
MCHC: 33.8 g/dL (ref 31.5–35.7)
MCV: 96 fL (ref 79–97)
Monocytes Absolute: 0.4 10*3/uL (ref 0.1–0.9)
Monocytes: 7 %
Neutrophils Absolute: 3.7 10*3/uL (ref 1.4–7.0)
Neutrophils: 65 %
Platelets: 244 10*3/uL (ref 150–450)
RBC: 4.82 x10E6/uL (ref 4.14–5.80)
RDW: 12.7 % (ref 11.6–15.4)
WBC: 5.8 10*3/uL (ref 3.4–10.8)

## 2021-09-17 LAB — CMP14+EGFR
ALT: 18 IU/L (ref 0–44)
AST: 23 IU/L (ref 0–40)
Albumin/Globulin Ratio: 2.1 (ref 1.2–2.2)
Albumin: 4.6 g/dL (ref 3.8–4.9)
Alkaline Phosphatase: 83 IU/L (ref 44–121)
BUN/Creatinine Ratio: 21 (ref 10–24)
BUN: 22 mg/dL (ref 8–27)
Bilirubin Total: <0.2 mg/dL (ref 0.0–1.2)
CO2: 25 mmol/L (ref 20–29)
Calcium: 9.2 mg/dL (ref 8.6–10.2)
Chloride: 103 mmol/L (ref 96–106)
Creatinine, Ser: 1.03 mg/dL (ref 0.76–1.27)
Globulin, Total: 2.2 g/dL (ref 1.5–4.5)
Glucose: 83 mg/dL (ref 70–99)
Potassium: 5.5 mmol/L — ABNORMAL HIGH (ref 3.5–5.2)
Sodium: 139 mmol/L (ref 134–144)
Total Protein: 6.8 g/dL (ref 6.0–8.5)
eGFR: 83 mL/min/{1.73_m2} (ref >59)

## 2021-09-17 LAB — LIPID PANEL
Chol/HDL Ratio: 2.9 ratio (ref 0.0–5.0)
Cholesterol, Total: 214 mg/dL — ABNORMAL HIGH (ref 100–199)
HDL: 75 mg/dL (ref >39)
LDL Chol Calc (NIH): 128 mg/dL — ABNORMAL HIGH (ref 0–99)
Triglycerides: 62 mg/dL (ref 0–149)
VLDL Cholesterol Cal: 11 mg/dL (ref 5–40)

## 2021-09-17 LAB — TSH: TSH: 1.42 u[IU]/mL (ref 0.450–4.500)

## 2021-09-30 ENCOUNTER — Telehealth: Payer: Self-pay | Admitting: Family Medicine

## 2021-10-04 DIAGNOSIS — M961 Postlaminectomy syndrome, not elsewhere classified: Secondary | ICD-10-CM | POA: Diagnosis not present

## 2021-10-04 DIAGNOSIS — Z79891 Long term (current) use of opiate analgesic: Secondary | ICD-10-CM | POA: Diagnosis not present

## 2021-10-04 DIAGNOSIS — M47812 Spondylosis without myelopathy or radiculopathy, cervical region: Secondary | ICD-10-CM | POA: Diagnosis not present

## 2021-10-04 DIAGNOSIS — G894 Chronic pain syndrome: Secondary | ICD-10-CM | POA: Diagnosis not present

## 2021-10-13 NOTE — Telephone Encounter (Signed)
Schedule awv  

## 2021-11-30 DIAGNOSIS — G894 Chronic pain syndrome: Secondary | ICD-10-CM | POA: Diagnosis not present

## 2021-11-30 DIAGNOSIS — Z79891 Long term (current) use of opiate analgesic: Secondary | ICD-10-CM | POA: Diagnosis not present

## 2021-11-30 DIAGNOSIS — M961 Postlaminectomy syndrome, not elsewhere classified: Secondary | ICD-10-CM | POA: Diagnosis not present

## 2021-11-30 DIAGNOSIS — M47812 Spondylosis without myelopathy or radiculopathy, cervical region: Secondary | ICD-10-CM | POA: Diagnosis not present

## 2022-01-24 DIAGNOSIS — Z79891 Long term (current) use of opiate analgesic: Secondary | ICD-10-CM | POA: Diagnosis not present

## 2022-01-24 DIAGNOSIS — G894 Chronic pain syndrome: Secondary | ICD-10-CM | POA: Diagnosis not present

## 2022-01-24 DIAGNOSIS — M961 Postlaminectomy syndrome, not elsewhere classified: Secondary | ICD-10-CM | POA: Diagnosis not present

## 2022-01-24 DIAGNOSIS — M47812 Spondylosis without myelopathy or radiculopathy, cervical region: Secondary | ICD-10-CM | POA: Diagnosis not present

## 2022-02-22 ENCOUNTER — Encounter: Payer: Self-pay | Admitting: Family Medicine

## 2022-02-22 ENCOUNTER — Ambulatory Visit (INDEPENDENT_AMBULATORY_CARE_PROVIDER_SITE_OTHER): Payer: PPO | Admitting: Family Medicine

## 2022-02-22 VITALS — BP 125/80 | HR 99 | Temp 98.1°F | Ht 71.0 in | Wt 187.2 lb

## 2022-02-22 DIAGNOSIS — R1011 Right upper quadrant pain: Secondary | ICD-10-CM | POA: Diagnosis not present

## 2022-02-22 DIAGNOSIS — M549 Dorsalgia, unspecified: Secondary | ICD-10-CM

## 2022-02-22 MED ORDER — METHOCARBAMOL 500 MG PO TABS: 500.0000 mg | ORAL_TABLET | Freq: Three times a day (TID) | ORAL | 0 refills | Status: DC | PRN

## 2022-02-22 NOTE — Progress Notes (Signed)
? ?Assessment & Plan:  ?1. RUQ abdominal pain ?Education provided on a gallbladder diet. ?- US Abdomen Limited RUQ (LIVER/GB); Future ? ?2. Musculoskeletal back pain ?Continue tiger balm and massage. Discussed heating pad and Lidoderm patches.  ?- methocarbamol (ROBAXIN) 500 MG tablet; Take 1 tablet (500 mg total) by mouth every 8 (eight) hours as needed for muscle spasms.  Dispense: 60 tablet; Refill: 0 ? ? ?Follow up plan: Return if symptoms worsen or fail to improve. ? ?Shawn Limes, MSN, APRN, FNP-C ?[facility] ? ?Subjective:  ? ?Patient ID: Shawn Russell., male    DOB: 30-Jun-1961, 61 y.o.   MRN: [id number] ? ?HPI: ?Shawn Russell. is a 61 y.o. male presenting on 02/22/2022 for Flank Pain (Right sided pain x 1 month.  Concerned it is his gallbladder ) and Back Pain (Upper back pain yesterday after almost having a fall but catching hisself.  ) ? ?Patient reports right upper quadrant abdominal pain that has been present for at least the past month.  Pain comes and goes, but states when it comes it can sometimes last all day.  He does have some nausea and gas.  Denies any diarrhea, but states he is most often constipated due to his opioid prescriptions. ? ?He is also having pain between his shoulder blades on the right side that began yesterday after a near fall going up the stairs. He has been applying tiger balm and using his shower massage which are helpful. He does have Morphine he takes for chronic pain.  ? ? ?ROS: Negative unless specifically indicated above in HPI.  ? ?Relevant past medical history reviewed and updated as indicated.  ? ?Allergies and medications reviewed and updated. ? ? ?Current Outpatient Medications:  ?  aspirin 325 MG tablet, Take 325 mg by mouth daily., Disp: , Rfl:  ?  HYDROmorphone (DILAUDID) 4 MG tablet, Take 4 mg by mouth every 4 (four) hours. (Patient not taking: Reported on 02/22/2022), Disp: , Rfl:  ?  morphine (MS CONTIN) 30 MG 12 hr tablet, Take 30  mg by mouth 2 (two) times daily as needed., Disp: , Rfl:  ? ?No Known Allergies ? ?Objective:  ? ?BP 125/80   Pulse 99   Temp 98.1 ?F (36.7 ?C) (Temporal)   Ht '5\' 11"'$  (1.803 m)   Wt 187 lb 3.2 oz (84.9 kg)   SpO2 94%   BMI 26.11 kg/m?   ? ?Physical Exam ?Vitals reviewed.  ?Constitutional:   ?   General: He is not in acute distress. ?   Appearance: Normal appearance. He is not ill-appearing, toxic-appearing or diaphoretic.  ?HENT:  ?   Head: Normocephalic and atraumatic.  ?Eyes:  ?   General: No scleral icterus.    ?   Right eye: No discharge.     ?   Left eye: No discharge.  ?   Conjunctiva/sclera: Conjunctivae normal.  ?Cardiovascular:  ?   Rate and Rhythm: Normal rate.  ?Pulmonary:  ?   Effort: Pulmonary effort is normal. No respiratory distress.  ?Abdominal:  ?   General: Abdomen is flat. Bowel sounds are normal. There is no distension or abdominal bruit. There are no signs of injury.  ?   Palpations: Abdomen is soft. There is no shifting dullness, fluid wave, hepatomegaly, splenomegaly, mass or pulsatile mass.  ?   Tenderness: There is abdominal tenderness in the right upper quadrant.  ?Musculoskeletal:     ?   General: Normal range of motion.  ?  Cervical back: Normal range of motion. Tenderness (right side muscular pain) present.  ?Skin: ?   General: Skin is warm and dry.  ?Neurological:  ?   Mental Status: He is alert and oriented to person, place, and time. Mental status is at baseline.  ?Psychiatric:     ?   Mood and Affect: Mood normal.     ?   Behavior: Behavior normal.     ?   Thought Content: Thought content normal.     ?   Judgment: Judgment normal.  ? ? ? ? ? ? ?

## 2022-03-04 ENCOUNTER — Ambulatory Visit (INDEPENDENT_AMBULATORY_CARE_PROVIDER_SITE_OTHER): Payer: PPO | Admitting: Family Medicine

## 2022-03-04 ENCOUNTER — Encounter: Payer: Self-pay | Admitting: Family Medicine

## 2022-03-04 VITALS — BP 134/82 | HR 63 | Temp 98.0°F | Ht 71.0 in | Wt 191.0 lb

## 2022-03-04 DIAGNOSIS — R1011 Right upper quadrant pain: Secondary | ICD-10-CM | POA: Diagnosis not present

## 2022-03-04 DIAGNOSIS — M4714 Other spondylosis with myelopathy, thoracic region: Secondary | ICD-10-CM | POA: Diagnosis not present

## 2022-03-04 DIAGNOSIS — M546 Pain in thoracic spine: Secondary | ICD-10-CM | POA: Diagnosis not present

## 2022-03-04 MED ORDER — DICLOFENAC SODIUM 1 % EX GEL: 2.0000 g | Freq: Four times a day (QID) | CUTANEOUS | 3 refills | Status: DC

## 2022-03-04 NOTE — Addendum Note (Signed)
Addended by: Alphonzo Dublin on: 03/04/2022 11:15 AM   Modules accepted: Orders

## 2022-03-04 NOTE — Addendum Note (Signed)
Addended by: [name] on: 03/04/2022 11:12 AM   Modules accepted: Orders

## 2022-03-04 NOTE — Progress Notes (Signed)
BP 134/82   Pulse 63   Temp 98 F (36.7 C)   Ht '5\' 11"'$  (1.803 m)   Wt 191 lb (86.6 kg)   SpO2 99%   BMI 26.64 kg/m    Subjective:   Patient ID: Shawn Hamburger., male    DOB: [birth year], 61 y.o.   MRN: [id number]  HPI: Shawn Gomes. is a 61 y.o. male presenting on 03/04/2022 for RUQ pain (Questions pulled muscle from fall vs gallbladder)   HPI Patient is coming in today for right thoracic pain that he heard about a week ago when he fell on that side and hit it and it hurts just under his scapula on the right side and hurts with deep inspiration and certain movements of his arm and that is been relatively new.  He does have a pain management doctor who he sees for his pain medicine but this is been bothering him more.  Patient also complains of right upper quadrant abdominal pain that is been bothering him over the past few months or maybe longer and get some indigestion and acid that is been increasing.  That is not severe but he has noticed that more along with this.  He does have a scheduled already right upper quadrant abdominal ultrasound for the 30th of this month.  He denies any fevers or chills or nausea or vomiting.  He does get some pain after eating on that right upper side.  Relevant past medical, surgical, family and social history reviewed and updated as indicated. Interim medical history since our last visit reviewed. Allergies and medications reviewed and updated.  Review of Systems  Constitutional:  Negative for chills and fever.  Eyes:  Negative for visual disturbance.  Respiratory:  Negative for shortness of breath and wheezing.   Cardiovascular:  Negative for chest pain and leg swelling.  Gastrointestinal:  Positive for abdominal pain. Negative for blood in stool, constipation, diarrhea, nausea, rectal pain and vomiting.  Musculoskeletal:  Positive for arthralgias, back pain and myalgias. Negative for gait problem.  Skin:  Negative for rash.  All other  systems reviewed and are negative.  Per HPI unless specifically indicated above   Allergies as of 03/04/2022   No Known Allergies      Medication List        Accurate as of Mar 04, 2022 11:09 AM. If you have any questions, ask your nurse or doctor.          STOP taking these medications    HYDROmorphone 4 MG tablet Commonly known as: DILAUDID Stopped by: Shawn Kaufmann Jan Olano, MD       TAKE these medications    aspirin 325 MG tablet Take 325 mg by mouth daily.   diclofenac Sodium 1 % Gel Commonly known as: Voltaren Apply 2 g topically 4 (four) times daily. Started by: Worthy Rancher, MD   methocarbamol 500 MG tablet Commonly known as: ROBAXIN Take 1 tablet (500 mg total) by mouth every 8 (eight) hours as needed for muscle spasms.   morphine 30 MG 12 hr tablet Commonly known as: MS CONTIN Take 30 mg by mouth 2 (two) times daily as needed.         Objective:   BP 134/82   Pulse 63   Temp 98 F (36.7 C)   Ht '5\' 11"'$  (1.803 m)   Wt 191 lb (86.6 kg)   SpO2 99%   BMI 26.64 kg/m   Wt Readings from Last 3  Encounters:  03/04/22 191 lb (86.6 kg)  02/22/22 187 lb 3.2 oz (84.9 kg)  09/16/21 186 lb (84.4 kg)    Physical Exam Vitals and nursing note reviewed.  Constitutional:      General: He is not in acute distress.    Appearance: He is well-developed. He is not diaphoretic.  Eyes:     General: No scleral icterus.    Conjunctiva/sclera: Conjunctivae normal.  Neck:     Thyroid: No thyromegaly.  Cardiovascular:     Rate and Rhythm: Normal rate and regular rhythm.     Heart sounds: Normal heart sounds. No murmur heard. Pulmonary:     Effort: Pulmonary effort is normal. No respiratory distress.     Breath sounds: Normal breath sounds. No wheezing.  Abdominal:     General: Abdomen is flat. Bowel sounds are normal. There is no distension.     Palpations: Abdomen is soft.     Tenderness: There is abdominal tenderness in the right upper quadrant.  There is no right CVA tenderness, left CVA tenderness, guarding or rebound.     Hernia: No hernia is present.  Musculoskeletal:        General: Normal range of motion.       Arms:     Cervical back: Neck supple.  Lymphadenopathy:     Cervical: No cervical adenopathy.  Skin:    General: Skin is warm and dry.     Findings: No rash.  Neurological:     Mental Status: He is alert and oriented to person, place, and time.     Coordination: Coordination normal.  Psychiatric:        Behavior: Behavior normal.      Assessment & Plan:   Problem List Items Addressed This Visit   None Visit Diagnoses     Right upper quadrant abdominal pain    -  Primary   Acute right-sided thoracic back pain       Relevant Medications   diclofenac Sodium (VOLTAREN) 1 % GEL   Osteoarthritis of thoracic spine with myelopathy       Relevant Medications   diclofenac Sodium (VOLTAREN) 1 % GEL       Likely muscular pain on right upper back, rib contusion or rib fracture is a possibility but it does not change the management so we will send Voltaren gel for him to help with that.  Patient already scheduled for right upper quadrant abdominal ultrasound, recommended to go ahead and do that and if still having issues in the future may consider antacid medicine adjustments or changes or addition. Follow up plan: Return if symptoms worsen or fail to improve.  Counseling provided for all of the vaccine components No orders of the defined types were placed in this encounter.   [name], MD [facility] 03/04/2022, 11:09 AM

## 2022-03-05 LAB — CMP14+EGFR
ALT: 18 IU/L (ref 0–44)
AST: 20 IU/L (ref 0–40)
Albumin/Globulin Ratio: 1.7 (ref 1.2–2.2)
Albumin: 4.2 g/dL (ref 3.8–4.8)
Alkaline Phosphatase: 94 IU/L (ref 44–121)
BUN/Creatinine Ratio: 21 (ref 10–24)
BUN: 20 mg/dL (ref 8–27)
Bilirubin Total: 0.2 mg/dL (ref 0.0–1.2)
CO2: 22 mmol/L (ref 20–29)
Calcium: 9 mg/dL (ref 8.6–10.2)
Chloride: 104 mmol/L (ref 96–106)
Creatinine, Ser: 0.95 mg/dL (ref 0.76–1.27)
Globulin, Total: 2.5 g/dL (ref 1.5–4.5)
Glucose: 98 mg/dL (ref 70–99)
Potassium: 5.1 mmol/L (ref 3.5–5.2)
Sodium: 141 mmol/L (ref 134–144)
Total Protein: 6.7 g/dL (ref 6.0–8.5)
eGFR: 91 mL/min/{1.73_m2} (ref >59)

## 2022-03-05 LAB — CBC WITH DIFFERENTIAL/PLATELET
Basophils Absolute: 0 10*3/uL (ref 0.0–0.2)
Basos: 0 %
EOS (ABSOLUTE): 0.1 10*3/uL (ref 0.0–0.4)
Eos: 2 %
Hematocrit: 42.4 % (ref 37.5–51.0)
Hemoglobin: 14.5 g/dL (ref 13.0–17.7)
Immature Grans (Abs): 0 10*3/uL (ref 0.0–0.1)
Immature Granulocytes: 0 %
Lymphocytes Absolute: 1.5 10*3/uL (ref 0.7–3.1)
Lymphs: 28 %
MCH: 32.3 pg (ref 26.6–33.0)
MCHC: 34.2 g/dL (ref 31.5–35.7)
MCV: 94 fL (ref 79–97)
Monocytes Absolute: 0.4 10*3/uL (ref 0.1–0.9)
Monocytes: 7 %
Neutrophils Absolute: 3.4 10*3/uL (ref 1.4–7.0)
Neutrophils: 63 %
Platelets: 235 10*3/uL (ref 150–450)
RBC: 4.49 x10E6/uL (ref 4.14–5.80)
RDW: 12.7 % (ref 11.6–15.4)
WBC: 5.5 10*3/uL (ref 3.4–10.8)

## 2022-03-10 ENCOUNTER — Other Ambulatory Visit: Payer: Self-pay | Admitting: Family Medicine

## 2022-03-10 DIAGNOSIS — M549 Dorsalgia, unspecified: Secondary | ICD-10-CM

## 2022-03-15 ENCOUNTER — Ambulatory Visit (HOSPITAL_COMMUNITY)
Admission: RE | Admit: 2022-03-15 | Discharge: 2022-03-15 | Disposition: A | Discharge status: Home / Self Care | Payer: PPO | Source: Ambulatory Visit | Attending: Family Medicine | Admitting: Family Medicine

## 2022-03-15 DIAGNOSIS — R1011 Right upper quadrant pain: Secondary | ICD-10-CM | POA: Insufficient documentation

## 2022-03-16 ENCOUNTER — Encounter: Payer: Self-pay | Admitting: Family Medicine

## 2022-03-16 ENCOUNTER — Other Ambulatory Visit: Payer: Self-pay

## 2022-03-16 NOTE — Telephone Encounter (Signed)
Pain could be muscular but it could also be from gallbladder sludge, let me know how it goes over the next few days to a week, if you are still having the pain that we may try for a HIDA scan which is a follow-up scan for the gallbladder.

## 2022-03-21 DIAGNOSIS — M47812 Spondylosis without myelopathy or radiculopathy, cervical region: Secondary | ICD-10-CM | POA: Diagnosis not present

## 2022-03-21 DIAGNOSIS — Z79891 Long term (current) use of opiate analgesic: Secondary | ICD-10-CM | POA: Diagnosis not present

## 2022-03-21 DIAGNOSIS — G894 Chronic pain syndrome: Secondary | ICD-10-CM | POA: Diagnosis not present

## 2022-03-21 DIAGNOSIS — M961 Postlaminectomy syndrome, not elsewhere classified: Secondary | ICD-10-CM | POA: Diagnosis not present

## 2022-05-02 DIAGNOSIS — N281 Cyst of kidney, acquired: Secondary | ICD-10-CM | POA: Diagnosis not present

## 2022-05-02 DIAGNOSIS — N200 Calculus of kidney: Secondary | ICD-10-CM | POA: Diagnosis not present

## 2022-05-13 DIAGNOSIS — N200 Calculus of kidney: Secondary | ICD-10-CM | POA: Diagnosis not present

## 2022-05-13 DIAGNOSIS — N281 Cyst of kidney, acquired: Secondary | ICD-10-CM | POA: Diagnosis not present

## 2022-05-13 DIAGNOSIS — N3289 Other specified disorders of bladder: Secondary | ICD-10-CM | POA: Diagnosis not present

## 2022-05-13 DIAGNOSIS — R3121 Asymptomatic microscopic hematuria: Secondary | ICD-10-CM | POA: Diagnosis not present

## 2022-05-16 DIAGNOSIS — G894 Chronic pain syndrome: Secondary | ICD-10-CM | POA: Diagnosis not present

## 2022-05-16 DIAGNOSIS — Z79891 Long term (current) use of opiate analgesic: Secondary | ICD-10-CM | POA: Diagnosis not present

## 2022-05-16 DIAGNOSIS — M47812 Spondylosis without myelopathy or radiculopathy, cervical region: Secondary | ICD-10-CM | POA: Diagnosis not present

## 2022-05-16 DIAGNOSIS — M961 Postlaminectomy syndrome, not elsewhere classified: Secondary | ICD-10-CM | POA: Diagnosis not present

## 2022-05-25 DIAGNOSIS — N50811 Right testicular pain: Secondary | ICD-10-CM | POA: Diagnosis not present

## 2022-05-25 DIAGNOSIS — N200 Calculus of kidney: Secondary | ICD-10-CM | POA: Diagnosis not present

## 2022-05-25 DIAGNOSIS — R3121 Asymptomatic microscopic hematuria: Secondary | ICD-10-CM | POA: Diagnosis not present

## 2022-06-07 DIAGNOSIS — R1031 Right lower quadrant pain: Secondary | ICD-10-CM | POA: Diagnosis not present

## 2022-06-07 DIAGNOSIS — K5900 Constipation, unspecified: Secondary | ICD-10-CM | POA: Diagnosis not present

## 2022-06-07 DIAGNOSIS — M6281 Muscle weakness (generalized): Secondary | ICD-10-CM | POA: Diagnosis not present

## 2022-06-07 DIAGNOSIS — M62838 Other muscle spasm: Secondary | ICD-10-CM | POA: Diagnosis not present

## 2022-06-07 DIAGNOSIS — M6289 Other specified disorders of muscle: Secondary | ICD-10-CM | POA: Diagnosis not present

## 2022-06-07 DIAGNOSIS — N50819 Testicular pain, unspecified: Secondary | ICD-10-CM | POA: Diagnosis not present

## 2022-06-13 DIAGNOSIS — Z79891 Long term (current) use of opiate analgesic: Secondary | ICD-10-CM | POA: Diagnosis not present

## 2022-06-13 DIAGNOSIS — M961 Postlaminectomy syndrome, not elsewhere classified: Secondary | ICD-10-CM | POA: Diagnosis not present

## 2022-06-13 DIAGNOSIS — G894 Chronic pain syndrome: Secondary | ICD-10-CM | POA: Diagnosis not present

## 2022-06-13 DIAGNOSIS — M47812 Spondylosis without myelopathy or radiculopathy, cervical region: Secondary | ICD-10-CM | POA: Diagnosis not present

## 2022-07-08 DIAGNOSIS — N3030 Trigonitis without hematuria: Secondary | ICD-10-CM | POA: Diagnosis not present

## 2022-07-08 DIAGNOSIS — R3121 Asymptomatic microscopic hematuria: Secondary | ICD-10-CM | POA: Diagnosis not present

## 2022-07-14 DIAGNOSIS — Z79891 Long term (current) use of opiate analgesic: Secondary | ICD-10-CM | POA: Diagnosis not present

## 2022-07-14 DIAGNOSIS — G894 Chronic pain syndrome: Secondary | ICD-10-CM | POA: Diagnosis not present

## 2022-07-14 DIAGNOSIS — M961 Postlaminectomy syndrome, not elsewhere classified: Secondary | ICD-10-CM | POA: Diagnosis not present

## 2022-07-14 DIAGNOSIS — M47812 Spondylosis without myelopathy or radiculopathy, cervical region: Secondary | ICD-10-CM | POA: Diagnosis not present

## 2022-08-06 ENCOUNTER — Other Ambulatory Visit: Payer: Self-pay | Admitting: Family Medicine

## 2022-08-06 DIAGNOSIS — M546 Pain in thoracic spine: Secondary | ICD-10-CM

## 2022-08-06 DIAGNOSIS — M4714 Other spondylosis with myelopathy, thoracic region: Secondary | ICD-10-CM

## 2022-09-05 DIAGNOSIS — G894 Chronic pain syndrome: Secondary | ICD-10-CM | POA: Diagnosis not present

## 2022-09-05 DIAGNOSIS — Z79891 Long term (current) use of opiate analgesic: Secondary | ICD-10-CM | POA: Diagnosis not present

## 2022-09-05 DIAGNOSIS — M47812 Spondylosis without myelopathy or radiculopathy, cervical region: Secondary | ICD-10-CM | POA: Diagnosis not present

## 2022-09-05 DIAGNOSIS — M961 Postlaminectomy syndrome, not elsewhere classified: Secondary | ICD-10-CM | POA: Diagnosis not present

## 2022-10-31 DIAGNOSIS — M47812 Spondylosis without myelopathy or radiculopathy, cervical region: Secondary | ICD-10-CM | POA: Diagnosis not present

## 2022-10-31 DIAGNOSIS — Z79891 Long term (current) use of opiate analgesic: Secondary | ICD-10-CM | POA: Diagnosis not present

## 2022-10-31 DIAGNOSIS — G894 Chronic pain syndrome: Secondary | ICD-10-CM | POA: Diagnosis not present

## 2022-10-31 DIAGNOSIS — M961 Postlaminectomy syndrome, not elsewhere classified: Secondary | ICD-10-CM | POA: Diagnosis not present

## 2022-12-26 DIAGNOSIS — M47812 Spondylosis without myelopathy or radiculopathy, cervical region: Secondary | ICD-10-CM | POA: Diagnosis not present

## 2022-12-26 DIAGNOSIS — Z79891 Long term (current) use of opiate analgesic: Secondary | ICD-10-CM | POA: Diagnosis not present

## 2022-12-26 DIAGNOSIS — G894 Chronic pain syndrome: Secondary | ICD-10-CM | POA: Diagnosis not present

## 2022-12-26 DIAGNOSIS — M961 Postlaminectomy syndrome, not elsewhere classified: Secondary | ICD-10-CM | POA: Diagnosis not present

## 2023-01-26 ENCOUNTER — Encounter: Payer: Self-pay | Admitting: *Deleted

## 2023-02-08 ENCOUNTER — Ambulatory Visit (HOSPITAL_BASED_OUTPATIENT_CLINIC_OR_DEPARTMENT_OTHER)
Admission: RE | Admit: 2023-02-08 | Discharge: 2023-02-08 | Disposition: A | Discharge status: Home / Self Care | Payer: PPO | Source: Ambulatory Visit | Attending: Family Medicine | Admitting: Family Medicine

## 2023-02-08 ENCOUNTER — Encounter: Payer: Self-pay | Admitting: Family Medicine

## 2023-02-08 ENCOUNTER — Ambulatory Visit (INDEPENDENT_AMBULATORY_CARE_PROVIDER_SITE_OTHER): Payer: PPO | Admitting: Family Medicine

## 2023-02-08 VITALS — BP 103/69 | HR 65 | Temp 98.0°F | Ht 71.0 in | Wt 193.0 lb

## 2023-02-08 DIAGNOSIS — R3129 Other microscopic hematuria: Secondary | ICD-10-CM | POA: Insufficient documentation

## 2023-02-08 DIAGNOSIS — R1031 Right lower quadrant pain: Secondary | ICD-10-CM | POA: Diagnosis not present

## 2023-02-08 DIAGNOSIS — R109 Unspecified abdominal pain: Secondary | ICD-10-CM | POA: Diagnosis not present

## 2023-02-08 DIAGNOSIS — I700 Atherosclerosis of aorta: Secondary | ICD-10-CM | POA: Diagnosis not present

## 2023-02-08 LAB — URINALYSIS
Bilirubin, UA: NEGATIVE
Glucose, UA: NEGATIVE
Ketones, UA: NEGATIVE
Leukocytes,UA: NEGATIVE
Nitrite, UA: NEGATIVE
Protein,UA: NEGATIVE
Specific Gravity, UA: 1.02 (ref 1.005–1.030)
Urobilinogen, Ur: 0.2 mg/dL (ref 0.2–1.0)
pH, UA: 5.5 (ref 5.0–7.5)

## 2023-02-08 MED ORDER — IOHEXOL 300 MG/ML  SOLN
100.0000 mL | Freq: Once | INTRAMUSCULAR | Status: AC | PRN
  Administered 2023-02-08: 100 mL via INTRAVENOUS

## 2023-02-08 NOTE — Progress Notes (Signed)
Subjective:  Patient ID: Shawn Mins., male    DOB: Feb 27, 1961  Age: 62 y.o. MRN: [id number]  CC: Abdominal Pain Worsening today.   HPI Shawn Mins. presents for 2 weeks of increasing RLQ pain. Nauseated some. Vomiting yesterday. Doesn't feel like a kidney stone. Dull ache.      02/08/2023    2:37 PM 02/22/2022    3:57 PM 09/16/2021   10:48 AM  Depression screen PHQ 2/9  Decreased Interest 0 0 0  Down, Depressed, Hopeless 0 0 0  PHQ - 2 Score 0 0 0  Altered sleeping  0 1  Tired, decreased energy  0 2  Change in appetite  0 0  Feeling bad or failure about yourself   0 0  Trouble concentrating  0 0  Moving slowly or fidgety/restless  0 0  Suicidal thoughts  0 0  PHQ-9 Score  0 3  Difficult doing work/chores  Not difficult at all     History Zenas has a past medical history of Chronic back pain, Chronic constipation, Chronic hepatitis C without hepatic coma, COPD (chronic obstructive pulmonary disease), Degenerative disc disease, lumbar, Depression, ED (erectile dysfunction), GERD (gastroesophageal reflux disease), Hearing loss of both ears, History of esophageal dilatation (2014), History of hypogonadism, History of kidney stones, History of TIAs, OA (osteoarthritis), Renal cyst, left, and Spinal stenosis.   He has a past surgical history that includes Knee surgery (Left, 1995); Rotator cuff repair (Right, 2000); surgery on ears (Bilateral, x3   as child); Ankle surgery (Right, 1990); kidney stone extraction (1989); maloney dilation (N/A, 02/14/2013); Esophagogastroduodenoscopy (egd) with propofol (N/A, 02/14/2013); Colonoscopy with propofol (N/A, 02/14/2013); Posterior lumbar fusion (10-04-2004   dr j. Lovell Sheehan ); Posterior lumbar fusion (06-26-2006   dr j. Lovell Sheehan  ); and Spermatocelectomy (Right, 12/13/2018).   His family history includes Heart disease in his father and mother; Osteoarthritis in his mother.He reports that he has been smoking cigarettes. He has a  40.00 pack-year smoking history. He has never used smokeless tobacco. He reports that he does not drink alcohol and does not use drugs.    ROS Review of Systems  Constitutional:  Positive for appetite change (decreased). Negative for activity change and fever.  Respiratory:  Negative for shortness of breath.   Cardiovascular:  Negative for chest pain.  Gastrointestinal:  Positive for nausea and vomiting. Negative for diarrhea.  Musculoskeletal:  Negative for arthralgias.  Skin:  Negative for rash.    Objective:  BP 103/69   Pulse 65   Temp 98 F (36.7 C)   Ht  (1.803 m)   Wt 193 lb (87.5 kg)   SpO2 99%   BMI 26.92 kg/m   BP Readings from Last 3 Encounters:  02/08/23 103/69  03/04/22 134/82  02/22/22 125/80    Wt Readings from Last 3 Encounters:  02/08/23 193 lb (87.5 kg)  03/04/22 191 lb (86.6 kg)  02/22/22 187 lb 3.2 oz (84.9 kg)     Physical Exam Vitals reviewed.  Constitutional:      Appearance: He is well-developed.  HENT:     Head: Normocephalic and atraumatic.     Right Ear: External ear normal.     Left Ear: External ear normal.     Mouth/Throat:     Pharynx: No oropharyngeal exudate or posterior oropharyngeal erythema.  Eyes:     Pupils: Pupils are equal, round, and reactive to light.  Cardiovascular:     Rate and Rhythm: Normal rate  and regular rhythm.     Heart sounds: No murmur heard. Pulmonary:     Effort: No respiratory distress.     Breath sounds: Normal breath sounds.  Abdominal:     Tenderness: There is abdominal tenderness (just above McBurney's point and just below tip of gallbladder.) in the right upper quadrant and right lower quadrant.  Musculoskeletal:     Cervical back: Normal range of motion and neck supple.  Neurological:     Mental Status: He is alert and oriented to person, place, and time.       Assessment & Plan:   Alper was seen today for abdominal pain.  Diagnoses and all orders for this visit:  Right  lower quadrant abdominal pain -     Urinalysis -     CT ABDOMEN PELVIS W WO CONTRAST; Future  Other microscopic hematuria -     CT ABDOMEN PELVIS W WO CONTRAST; Future       I have discontinued Corbin Ade Jr.'s aspirin, methocarbamol, and diclofenac Sodium. I am also having him maintain his morphine.  Allergies as of 02/08/2023   No Known Allergies      Medication List        Accurate as of February 08, 2023  3:20 PM. If you have any questions, ask your nurse or doctor.          STOP taking these medications    aspirin 325 MG tablet Stopped by: Mechele Claude, MD   diclofenac Sodium 1 % Gel Commonly known as: VOLTAREN Stopped by: Mechele Claude, MD   methocarbamol 500 MG tablet Commonly known as: ROBAXIN Stopped by: Mechele Claude, MD       TAKE these medications    morphine 30 MG 12 hr tablet Commonly known as: MS CONTIN Take 30 mg by mouth 2 (two) times daily as needed.       Negative [place] a year ago for gall stones  Follow-up: Return if symptoms worsen or fail to improve.  Mechele Claude, M.D.

## 2023-02-09 ENCOUNTER — Ambulatory Visit (HOSPITAL_BASED_OUTPATIENT_CLINIC_OR_DEPARTMENT_OTHER): Payer: PPO

## 2023-02-09 ENCOUNTER — Telehealth: Payer: Self-pay | Admitting: Family Medicine

## 2023-02-09 NOTE — Telephone Encounter (Signed)
Informed pt that the results have not been reviewed and that we would call when ready.  Pt states that he is in a lot of pain. Will send to ordering provider to make aware that pt has called.

## 2023-02-16 ENCOUNTER — Other Ambulatory Visit: Payer: Self-pay

## 2023-02-16 ENCOUNTER — Other Ambulatory Visit (HOSPITAL_BASED_OUTPATIENT_CLINIC_OR_DEPARTMENT_OTHER): Payer: Self-pay

## 2023-02-16 MED ORDER — HYDROMORPHONE HCL 4 MG PO TABS
4.0000 mg | ORAL_TABLET | Freq: Four times a day (QID) | ORAL | 0 refills | Status: DC | PRN
  Filled 2023-02-16: qty 120, 30d supply, fill #0

## 2023-02-20 DIAGNOSIS — M47812 Spondylosis without myelopathy or radiculopathy, cervical region: Secondary | ICD-10-CM | POA: Diagnosis not present

## 2023-02-20 DIAGNOSIS — M961 Postlaminectomy syndrome, not elsewhere classified: Secondary | ICD-10-CM | POA: Diagnosis not present

## 2023-02-20 DIAGNOSIS — G894 Chronic pain syndrome: Secondary | ICD-10-CM | POA: Diagnosis not present

## 2023-02-20 DIAGNOSIS — Z79891 Long term (current) use of opiate analgesic: Secondary | ICD-10-CM | POA: Diagnosis not present

## 2023-02-21 DIAGNOSIS — G894 Chronic pain syndrome: Secondary | ICD-10-CM | POA: Diagnosis not present

## 2023-02-21 DIAGNOSIS — Z79891 Long term (current) use of opiate analgesic: Secondary | ICD-10-CM | POA: Diagnosis not present

## 2023-03-17 ENCOUNTER — Other Ambulatory Visit (HOSPITAL_BASED_OUTPATIENT_CLINIC_OR_DEPARTMENT_OTHER): Payer: Self-pay

## 2023-03-17 MED ORDER — HYDROMORPHONE HCL 4 MG PO TABS
4.0000 mg | ORAL_TABLET | Freq: Four times a day (QID) | ORAL | 0 refills | Status: DC
  Filled 2023-03-17: qty 120, 30d supply, fill #0

## 2023-04-12 ENCOUNTER — Other Ambulatory Visit (HOSPITAL_BASED_OUTPATIENT_CLINIC_OR_DEPARTMENT_OTHER): Payer: Self-pay

## 2023-04-17 DIAGNOSIS — G894 Chronic pain syndrome: Secondary | ICD-10-CM | POA: Diagnosis not present

## 2023-04-17 DIAGNOSIS — Z79891 Long term (current) use of opiate analgesic: Secondary | ICD-10-CM | POA: Diagnosis not present

## 2023-04-17 DIAGNOSIS — M961 Postlaminectomy syndrome, not elsewhere classified: Secondary | ICD-10-CM | POA: Diagnosis not present

## 2023-04-17 DIAGNOSIS — M47812 Spondylosis without myelopathy or radiculopathy, cervical region: Secondary | ICD-10-CM | POA: Diagnosis not present

## 2023-06-12 DIAGNOSIS — G894 Chronic pain syndrome: Secondary | ICD-10-CM | POA: Diagnosis not present

## 2023-06-12 DIAGNOSIS — M47812 Spondylosis without myelopathy or radiculopathy, cervical region: Secondary | ICD-10-CM | POA: Diagnosis not present

## 2023-06-12 DIAGNOSIS — M961 Postlaminectomy syndrome, not elsewhere classified: Secondary | ICD-10-CM | POA: Diagnosis not present

## 2023-06-12 DIAGNOSIS — Z79891 Long term (current) use of opiate analgesic: Secondary | ICD-10-CM | POA: Diagnosis not present

## 2023-07-02 DIAGNOSIS — Z7982 Long term (current) use of aspirin: Secondary | ICD-10-CM | POA: Diagnosis not present

## 2023-07-02 DIAGNOSIS — T1592XA Foreign body on external eye, part unspecified, left eye, initial encounter: Secondary | ICD-10-CM | POA: Diagnosis not present

## 2023-07-02 DIAGNOSIS — Z0389 Encounter for observation for other suspected diseases and conditions ruled out: Secondary | ICD-10-CM | POA: Diagnosis not present

## 2023-08-08 ENCOUNTER — Other Ambulatory Visit (HOSPITAL_BASED_OUTPATIENT_CLINIC_OR_DEPARTMENT_OTHER): Payer: Self-pay

## 2023-08-08 DIAGNOSIS — M47812 Spondylosis without myelopathy or radiculopathy, cervical region: Secondary | ICD-10-CM | POA: Diagnosis not present

## 2023-08-08 DIAGNOSIS — Z79891 Long term (current) use of opiate analgesic: Secondary | ICD-10-CM | POA: Diagnosis not present

## 2023-08-08 DIAGNOSIS — M961 Postlaminectomy syndrome, not elsewhere classified: Secondary | ICD-10-CM | POA: Diagnosis not present

## 2023-08-08 DIAGNOSIS — G894 Chronic pain syndrome: Secondary | ICD-10-CM | POA: Diagnosis not present

## 2023-08-08 MED ORDER — HYDROMORPHONE HCL 4 MG PO TABS
4.0000 mg | ORAL_TABLET | Freq: Four times a day (QID) | ORAL | 0 refills | Status: DC | PRN
  Filled 2023-08-08: qty 120, 30d supply, fill #0

## 2023-09-05 ENCOUNTER — Other Ambulatory Visit (HOSPITAL_COMMUNITY): Payer: Self-pay

## 2023-09-05 ENCOUNTER — Other Ambulatory Visit (HOSPITAL_BASED_OUTPATIENT_CLINIC_OR_DEPARTMENT_OTHER): Payer: Self-pay

## 2023-09-05 MED ORDER — HYDROMORPHONE HCL 4 MG PO TABS
4.0000 mg | ORAL_TABLET | Freq: Four times a day (QID) | ORAL | 0 refills | Status: DC | PRN
  Filled 2023-09-05: qty 113, 29d supply, fill #0

## 2023-10-02 ENCOUNTER — Other Ambulatory Visit (HOSPITAL_BASED_OUTPATIENT_CLINIC_OR_DEPARTMENT_OTHER): Payer: Self-pay

## 2023-10-02 MED ORDER — HYDROMORPHONE HCL 4 MG PO TABS
4.0000 mg | ORAL_TABLET | Freq: Four times a day (QID) | ORAL | 0 refills | Status: DC | PRN
  Filled 2023-10-02: qty 8, 2d supply, fill #0

## 2023-10-06 ENCOUNTER — Other Ambulatory Visit (HOSPITAL_BASED_OUTPATIENT_CLINIC_OR_DEPARTMENT_OTHER): Payer: Self-pay

## 2023-10-06 DIAGNOSIS — M961 Postlaminectomy syndrome, not elsewhere classified: Secondary | ICD-10-CM | POA: Diagnosis not present

## 2023-10-06 DIAGNOSIS — Z79891 Long term (current) use of opiate analgesic: Secondary | ICD-10-CM | POA: Diagnosis not present

## 2023-10-06 DIAGNOSIS — M47812 Spondylosis without myelopathy or radiculopathy, cervical region: Secondary | ICD-10-CM | POA: Diagnosis not present

## 2023-10-06 DIAGNOSIS — G894 Chronic pain syndrome: Secondary | ICD-10-CM | POA: Diagnosis not present

## 2023-10-06 MED ORDER — HYDROMORPHONE HCL 4 MG PO TABS
4.0000 mg | ORAL_TABLET | Freq: Four times a day (QID) | ORAL | 0 refills | Status: DC | PRN
  Filled 2023-10-06: qty 120, 30d supply, fill #0

## 2023-10-06 MED ORDER — PREDNISONE 10 MG PO TABS
ORAL_TABLET | ORAL | 0 refills | Status: AC
  Filled 2023-10-06: qty 30, 10d supply, fill #0

## 2023-11-02 ENCOUNTER — Other Ambulatory Visit (HOSPITAL_BASED_OUTPATIENT_CLINIC_OR_DEPARTMENT_OTHER): Payer: Self-pay

## 2023-11-02 MED ORDER — HYDROMORPHONE HCL 4 MG PO TABS
4.0000 mg | ORAL_TABLET | Freq: Four times a day (QID) | ORAL | 0 refills | Status: DC | PRN
  Filled 2023-11-03: qty 120, 30d supply, fill #0

## 2023-11-03 ENCOUNTER — Other Ambulatory Visit (HOSPITAL_BASED_OUTPATIENT_CLINIC_OR_DEPARTMENT_OTHER): Payer: Self-pay

## 2023-11-09 ENCOUNTER — Encounter: Payer: Self-pay | Admitting: Family Medicine

## 2023-11-09 ENCOUNTER — Telehealth: Payer: Self-pay | Admitting: Family Medicine

## 2023-11-09 ENCOUNTER — Ambulatory Visit: Payer: PPO | Admitting: Family Medicine

## 2023-11-09 ENCOUNTER — Other Ambulatory Visit: Payer: PPO

## 2023-11-09 ENCOUNTER — Ambulatory Visit: Payer: Self-pay | Admitting: Family Medicine

## 2023-11-09 VITALS — BP 112/76 | HR 58 | Temp 98.3°F | Ht 71.0 in | Wt 197.0 lb

## 2023-11-09 DIAGNOSIS — R531 Weakness: Secondary | ICD-10-CM

## 2023-11-09 DIAGNOSIS — R42 Dizziness and giddiness: Secondary | ICD-10-CM

## 2023-11-09 DIAGNOSIS — R001 Bradycardia, unspecified: Secondary | ICD-10-CM | POA: Diagnosis not present

## 2023-11-09 DIAGNOSIS — E875 Hyperkalemia: Secondary | ICD-10-CM

## 2023-11-09 NOTE — Progress Notes (Signed)
Subjective:  Patient ID: Shawn Mins., male    DOB: 1961-03-29, 63 y.o.   MRN: [id number]  Patient Care Team: Dettinger, Elige Radon, MD as PCP - General (Family Medicine)   Chief Complaint:  Near Syncope   HPI: Shawn Burrington. is a 63 y.o. male presenting on 11/09/2023 for Near Syncope  States that he feels like he could "fall out".  States that it happened last night and this morning. State that he feels lightheaded and must lie down to not fall. Denies palpitations, shortness of breath, chest pain, diaphoresis. Denies LOC. Denies one sided weakness, facial drooping, slurred speech. Reports that during the episode this morning he checked his BP and HR. HR was 55 and BP was 105/69. Reports that symptoms last for ~10 - 30 minutes. Reports that he does not drink a lot of water. He mainly drinks tea, sweet. Reports a history of vertigo, however, states that this is different from that.   Relevant past medical, surgical, family, and social history reviewed and updated as indicated.  Allergies and medications reviewed and updated. Data reviewed: Chart in Epic.   Past Medical History:  Diagnosis Date   Chronic back pain    Chronic constipation    Chronic hepatitis C without hepatic coma (HCC)    blood transfusion 1988;   per pt was treated with mediation approx. 2010 was followed by liver clinic @ [facility] and has been cured   COPD (chronic obstructive pulmonary disease) (HCC)    Degenerative disc disease, lumbar    Depression    ED (erectile dysfunction)    GERD (gastroesophageal reflux disease)    Hearing loss of both ears    per pt has hearing aids but does not wear   History of esophageal dilatation 2014   History of hypogonadism    History of kidney stones    History of TIAs    per pt approx. 2004   OA (osteoarthritis)    knees   Renal cyst, left    Spinal stenosis     Past Surgical History:  Procedure Laterality Date   ANKLE SURGERY Right 1990   ORIF right  ankle fracture;  retained hardware   COLONOSCOPY WITH PROPOFOL N/A 02/14/2013   Procedure: COLONOSCOPY WITH PROPOFOL;  Surgeon: Corbin Ade, MD;  Location: [facility];  Service: Endoscopy;  Laterality: N/A;  At cecum:09:16,  cecal withdrawal time:0930, total cecal time:14 minutes   ESOPHAGOGASTRODUODENOSCOPY (EGD) WITH PROPOFOL N/A 02/14/2013   Procedure: ESOPHAGOGASTRODUODENOSCOPY (EGD) WITH PROPOFOL;  Surgeon: Corbin Ade, MD;  Location: [facility];  Service: Endoscopy;  Laterality: N/A;   kidney stone extraction  1989   ureteroscopy stone extraction   KNEE SURGERY Left 1995   Reconstruction w/ retained hardware   MALONEY DILATION N/A 02/14/2013   Procedure: Elease Hashimoto DILATION;  Surgeon: Corbin Ade, MD;  Location: [facility];  Service: Endoscopy;  Laterality: N/A;   POSTERIOR LUMBAR FUSION  10-04-2004   dr Shela Commons. Lovell Sheehan @[facility]    laminectomy and fusion L3 -- L5   POSTERIOR LUMBAR FUSION  06-26-2006   dr Shela Commons. Lovell Sheehan  @[facility]    Re-Do laminectomy L3,  fusion L2 -- L4   ROTATOR CUFF REPAIR Right 2000   SPERMATOCELECTOMY Right 12/13/2018   Procedure: SPERMATOCELECTOMY;  Surgeon: Bjorn Pippin, MD;  Location: [facility];  Service: Urology;  Laterality: Right;   surgery on ears Bilateral x3   as child    Social History   Socioeconomic History  Marital status: Legally Separated    Spouse name: Not on file   Number of children: Not on file   Years of education: Not on file   Highest education level: Not on file  Occupational History   Not on file  Tobacco Use   Smoking status: Every Day    Current packs/day: 1.00    Average packs/day: 1 pack/day for 40.0 years (40.0 ttl pk-yrs)    Types: Cigarettes   Smokeless tobacco: Never  Vaping Use   Vaping status: Never Used  Substance and Sexual Activity   Alcohol use: No   Drug use: No   Sexual activity: Not Currently  Other Topics Concern   Not on file  Social History Narrative   Not on file   Social Drivers of Health   Financial Resource Strain: Not on  file  Food Insecurity: Not on file  Transportation Needs: Not on file  Physical Activity: Not on file  Stress: Not on file  Social Connections: Not on file  Intimate Partner Violence: Not on file    Outpatient Encounter Medications as of 11/09/2023  Medication Sig   HYDROmorphone (DILAUDID) 4 MG tablet Take 1 tablet (4 mg total) by mouth 4 (four) times daily as needed.   HYDROmorphone (DILAUDID) 4 MG tablet Take 1 tablet (4 mg total) by mouth 4 (four) times daily. Stop hydromorphone er   HYDROmorphone (DILAUDID) 4 MG tablet Take 1 tablet (4 mg total) by mouth 4 (four) times daily as needed for pain.   HYDROmorphone (DILAUDID) 4 MG tablet Take 1 tablet (4 mg total) by mouth 4 (four) times daily as needed for pain   HYDROmorphone (DILAUDID) 4 MG tablet Take 1 tablet (4 mg total) by mouth 4 (four) times daily as needed for pain   HYDROmorphone (DILAUDID) 4 MG tablet Take 1 tablet (4 mg total) by mouth 4 (four) times daily as needed for pain.   HYDROmorphone (DILAUDID) 4 MG tablet Take 1 tablet (4 mg total) by mouth 4 (four) times daily as needed.   [DISCONTINUED] morphine (MS CONTIN) 30 MG 12 hr tablet Take 30 mg by mouth 2 (two) times daily as needed.   No facility-administered encounter medications on file as of 11/09/2023.    No Known Allergies  Review of Systems As per HPI  Objective:  BP 112/76   Pulse (!) 58   Temp 98.3 F (36.8 C)   Ht 5\' 11"  (1.803 m)   Wt 197 lb (89.4 kg)   SpO2 97%   BMI 27.48 kg/m   Orthostatic VS for the past 72 hrs (Last 3 readings):  Orthostatic BP Patient Position BP Location Cuff Size Orthostatic Pulse  11/09/23 1435 112/76 Sitting Right Arm Large 58  11/09/23 1434 116/76 Supine Right Arm Large 55    Wt Readings from Last 3 Encounters:  11/09/23 197 lb (89.4 kg)  02/08/23 193 lb (87.5 kg)  03/04/22 191 lb (86.6 kg)    Physical Exam Constitutional:      General: He is awake. He is not in acute distress.    Appearance: Normal appearance. He  is well-developed and well-groomed. He is not ill-appearing, toxic-appearing or diaphoretic.  Eyes:     Extraocular Movements:     Right eye: Normal extraocular motion.     Left eye: Normal extraocular motion.  Cardiovascular:     Rate and Rhythm: Regular rhythm. Bradycardia present.     Pulses: Normal pulses.          Radial pulses  are 2+ on the right side and 2+ on the left side.       Posterior tibial pulses are 2+ on the right side and 2+ on the left side.     Heart sounds: Normal heart sounds. No murmur heard.    No gallop.  Pulmonary:     Effort: Pulmonary effort is normal. No respiratory distress.     Breath sounds: Normal breath sounds. No stridor. No wheezing, rhonchi or rales.  Musculoskeletal:     Cervical back: Full passive range of motion without pain and neck supple.     Right lower leg: No edema.     Left lower leg: No edema.  Skin:    General: Skin is warm.     Capillary Refill: Capillary refill takes less than 2 seconds.  Neurological:     General: No focal deficit present.     Mental Status: He is alert, oriented to person, place, and time and easily aroused. Mental status is at baseline.     GCS: GCS eye subscore is 4. GCS verbal subscore is 5. GCS motor subscore is 6.     Cranial Nerves: No cranial nerve deficit, dysarthria or facial asymmetry.     Motor: No weakness, tremor, atrophy, abnormal muscle tone, seizure activity or pronator drift.     Coordination: Romberg sign negative. Coordination normal.     Gait: Gait and tandem walk normal.     Comments: Difficult to complete tandem walking due to pain in back and knees, however patient was not dizzy with testing  Bilateral grip and strength 5/5   Psychiatric:        Attention and Perception: Attention and perception normal.        Mood and Affect: Mood and affect normal.        Speech: Speech normal.        Behavior: Behavior normal. Behavior is cooperative.        Thought Content: Thought content normal.  Thought content does not include homicidal or suicidal ideation. Thought content does not include homicidal or suicidal plan.        Cognition and Memory: Cognition and memory normal.        Judgment: Judgment normal.     Results for orders placed or performed in visit on 02/08/23  Urinalysis   Collection Time: 02/08/23  3:13 PM  Result Value Ref Range   Specific Gravity, UA 1.020 1.005 - 1.030   pH, UA 5.5 5.0 - 7.5   Color, UA Yellow Yellow   Appearance Ur Clear Clear   Leukocytes,UA Negative Negative   Protein,UA Negative Negative/Trace   Glucose, UA Negative Negative   Ketones, UA Negative Negative   RBC, UA 2+ (A) Negative   Bilirubin, UA Negative Negative   Urobilinogen, Ur 0.2 0.2 - 1.0 mg/dL   Nitrite, UA Negative Negative       11/09/2023    2:42 PM 02/08/2023    2:37 PM 02/22/2022    3:57 PM 09/16/2021   10:48 AM 11/19/2020   11:26 AM  Depression screen PHQ 2/9  Decreased Interest 0 0 0 0 0  Down, Depressed, Hopeless 0 0 0 0 0  PHQ - 2 Score 0 0 0 0 0  Altered sleeping   0 1   Tired, decreased energy   0 2   Change in appetite   0 0   Feeling bad or failure about yourself    0 0   Trouble concentrating  0 0   Moving slowly or fidgety/restless   0 0   Suicidal thoughts   0 0   PHQ-9 Score   0 3   Difficult doing work/chores   Not difficult at all         11/09/2023    2:42 PM 02/22/2022    3:58 PM 09/16/2021   10:48 AM 02/20/2018    4:22 PM  GAD 7 : Generalized Anxiety Score  Nervous, Anxious, on Edge 0 0 1 3  Control/stop worrying 0 0 0 3  Worry too much - different things 0 0 0 2  Trouble relaxing 0 0 0 2  Restless 0 0 0 2  Easily annoyed or irritable 0 0 0 3  Afraid - awful might happen 0 0 0 2  Total GAD 7 Score 0 0 1 17  Anxiety Difficulty Not difficult at all Not difficult at all  Very difficult   Pertinent labs & imaging results that were available during my care of the patient were reviewed by me and considered in my medical decision  making.  Assessment & Plan:  1. Dizziness (Primary) Will start with labs and zio. Negative for orthostatic hypotension. Recommended patient to increase water intake. Will await results for next steps. Discussed with patient red flag symptoms for him to monitor and when to seek emergency care.  - Anemia Profile B - CMP14+EGFR - VITAMIN D 25 Hydroxy (Vit-D Deficiency, Fractures) - TSH - Magnesium - LONG TERM MONITOR (3-14 DAYS); Future  2. Weakness Intact neuro exam. Will start with labs and zio. Negative for orthostatic hypotension. Recommended patient to increase water intake. Will await results for next steps. Discussed with patient red flag symptoms for him to monitor and when to seek emergency care.  - Anemia Profile B - CMP14+EGFR - VITAMIN D 25 Hydroxy (Vit-D Deficiency, Fractures) - TSH - Magnesium - LONG TERM MONITOR (3-14 DAYS); Future  3. Bradycardia Patient has lower HR on exams in the past. Not unlike what was in office today. Rhythm normal on exam. Will start monitor as below.  - LONG TERM MONITOR (3-14 DAYS); Future    Continue all other maintenance medications.  Follow up plan: Return if symptoms worsen or fail to improve.  Continue healthy lifestyle choices, including diet (rich in fruits, vegetables, and lean proteins, and low in salt and simple carbohydrates) and exercise (at least 30 minutes of moderate physical activity daily).  Written and verbal instructions provided   The above assessment and management plan was discussed with the patient. The patient verbalized understanding of and has agreed to the management plan. Patient is aware to call the clinic if they develop any new symptoms or if symptoms persist or worsen. Patient is aware when to return to the clinic for a follow-up visit. Patient educated on when it is appropriate to go to the emergency department.   Neale Burly, DNP-FNP [facility] [address] [place], [place] [address] [phone]

## 2023-11-09 NOTE — Telephone Encounter (Unsigned)
Copied from CRM [id number]. Topic: Clinical - Red Word Triage >> Nov 09, 2023 12:44 PM Clayton Bibles wrote: Red Word that prompted transfer to Nurse Triage: The last 2 days he is having spells like he could pass out. It happens at any time (standing, sitting, laying down) No fever known, lives alone. He has not passed out be has the feeling he will hit the ground.

## 2023-11-09 NOTE — Telephone Encounter (Signed)
Copied from CRM [id number]. Topic: Clinical - Red Word Triage >> Nov 09, 2023 12:44 PM Shawn Russell wrote: Red Word that prompted transfer to Nurse Triage: The last 2 days he is having spells like he could pass out. It happens at any time (standing, sitting, laying down) No fever known, lives alone. He has not passed out be has the feeling he will hit the ground.   Chief Complaint: Dizziness/Lightheadedness episodes off and on for the past two days Symptoms: dizziness/lightheadedness Frequency: 2 days Pertinent Negatives: Patient denies vertigo(spinning sensation), chest pain, vomiting, fever, diarrhea, bleeding, feeling dizzy/lightheaded at the time of triage. Disposition: [] ED /[] Urgent Care (no appt availability in office) / [x] Appointment(In office/virtual)/ []  [facility]/ [] Home Care/ [] Refused Recommended Disposition /[] [place] [facility]/ []  Follow-up with PCP Additional Notes: Patient called and advised that for the past two days he has felt like he could pass out and the feeling comes and goes. He said he had a few episodes like this in the summer but it went away. 4 episodes altogether in these past two days. He states that he is eating good and is well hydrated.  He said that when the episodes happen they don't last very long--less than 30 minutes and he lays there until the feeling passes.  He has not tried to get up and walk because he doesn't want to fall.  He has not completely passed out but felt like it during the episodes.  He did take his own vital signs after an episode earlier and said that his blood pressure was 111/71 and his heart rate was 59. Patient denies vertigo(spinning sensation), chest pain, fever, diarrhea, bleeding, feeling dizzy/lightheaded at the time of triage.  He said that he felt fine during triage just slightly tired.  He also denies any vomiting. Appointment is made for today 11/09/2023 with the Doctor of the Day at patient's PCP office at 2:35 pm.   Patient states he can get someone to drive him for safety precautions and if anything gets worse or he has another episode he is advised to call 911 or go straight to the emergency room.  Patient verbalized understanding.  Reason for Disposition  [1] MODERATE dizziness (e.g., interferes with normal activities) AND [2] has NOT been evaluated by doctor (or NP/PA) for this  (Exception: Dizziness caused by heat exposure, sudden standing, or poor fluid intake.)  Answer Assessment - Initial Assessment Questions 1. DESCRIPTION: "Describe your dizziness."     Feels like he might pass out 2. LIGHTHEADED: "Do you feel lightheaded?" (e.g., somewhat faint, woozy, weak upon standing)     faint 3. VERTIGO: "Do you feel like either you or the room is spinning or tilting?" (i.e. vertigo)     No 4. SEVERITY: "How bad is it?"  "Do you feel like you are going to faint?" "Can you stand and walk?"   - MILD: Feels slightly dizzy, but walking normally.   - MODERATE: Feels unsteady when walking, but not falling; interferes with normal activities (e.g., school, work).   - SEVERE: Unable to walk without falling, or requires assistance to walk without falling; feels like passing out now.      "I hadnt tried bc I was scared I would fall" 5. ONSET:  "When did the dizziness begin?"     2 days ago 6. AGGRAVATING FACTORS: "Does anything make it worse?" (e.g., standing, change in head position)     "I have to lay down for a little bit" 7. HEART  RATE: "Can you tell me your heart rate?" "How many beats in 15 seconds?"  (Note: not all patients can do this)       Patient checked this morning his pulse was 59 and bp was 111/71 right after an episode 8. CAUSE: "What do you think is causing the dizziness?"     unknown 9. RECURRENT SYMPTOM: "Have you had dizziness before?" If Yes, ask: "When was the last time?" "What happened that time?"     Summer  went away 10. OTHER SYMPTOMS: "Do you have any other symptoms?" (e.g., fever,  chest pain, vomiting, diarrhea, bleeding)       No  Protocols used: Dizziness - Lightheadedness-A-AH

## 2023-11-10 ENCOUNTER — Encounter: Payer: Self-pay | Admitting: Family Medicine

## 2023-11-10 LAB — CMP14+EGFR
ALT: 23 [IU]/L (ref 0–44)
AST: 20 [IU]/L (ref 0–40)
Albumin: 4.2 g/dL (ref 3.9–4.9)
Alkaline Phosphatase: 62 [IU]/L (ref 44–121)
BUN/Creatinine Ratio: 20 (ref 10–24)
BUN: 17 mg/dL (ref 8–27)
Bilirubin Total: 0.4 mg/dL (ref 0.0–1.2)
CO2: 24 mmol/L (ref 20–29)
Calcium: 9.1 mg/dL (ref 8.6–10.2)
Chloride: 100 mmol/L (ref 96–106)
Creatinine, Ser: 0.86 mg/dL (ref 0.76–1.27)
Globulin, Total: 2.7 g/dL (ref 1.5–4.5)
Glucose: 79 mg/dL (ref 70–99)
Potassium: 5.3 mmol/L — ABNORMAL HIGH (ref 3.5–5.2)
Sodium: 137 mmol/L (ref 134–144)
Total Protein: 6.9 g/dL (ref 6.0–8.5)
eGFR: 97 mL/min/{1.73_m2} (ref >59)

## 2023-11-10 LAB — TSH: TSH: 1.93 u[IU]/mL (ref 0.450–4.500)

## 2023-11-10 LAB — MAGNESIUM: Magnesium: 2.1 mg/dL (ref 1.6–2.3)

## 2023-11-10 LAB — ANEMIA PROFILE B
Basophils Absolute: 0 10*3/uL (ref 0.0–0.2)
Basos: 0 %
EOS (ABSOLUTE): 0.2 10*3/uL (ref 0.0–0.4)
Eos: 3 %
Ferritin: 301 ng/mL (ref 30–400)
Folate: >20 ng/mL (ref >3.0)
Hematocrit: 43.9 % (ref 37.5–51.0)
Hemoglobin: 14.9 g/dL (ref 13.0–17.7)
Immature Grans (Abs): 0 10*3/uL (ref 0.0–0.1)
Immature Granulocytes: 0 %
Iron Saturation: 29 % (ref 15–55)
Iron: 87 ug/dL (ref 38–169)
Lymphocytes Absolute: 1.8 10*3/uL (ref 0.7–3.1)
Lymphs: 31 %
MCH: 32.7 pg (ref 26.6–33.0)
MCHC: 33.9 g/dL (ref 31.5–35.7)
MCV: 96 fL (ref 79–97)
Monocytes Absolute: 0.5 10*3/uL (ref 0.1–0.9)
Monocytes: 9 %
Neutrophils Absolute: 3.4 10*3/uL (ref 1.4–7.0)
Neutrophils: 57 %
Platelets: 232 10*3/uL (ref 150–450)
RBC: 4.56 x10E6/uL (ref 4.14–5.80)
RDW: 12.5 % (ref 11.6–15.4)
Retic Ct Pct: 1.3 % (ref 0.6–2.6)
Total Iron Binding Capacity: 297 ug/dL (ref 250–450)
UIBC: 210 ug/dL (ref 111–343)
Vitamin B-12: 1389 pg/mL — ABNORMAL HIGH (ref 232–1245)
WBC: 5.9 10*3/uL (ref 3.4–10.8)

## 2023-11-10 LAB — VITAMIN D 25 HYDROXY (VIT D DEFICIENCY, FRACTURES): Vit D, 25-Hydroxy: 19.1 ng/mL — ABNORMAL LOW (ref 30.0–100.0)

## 2023-11-10 MED ORDER — VITAMIN D (ERGOCALCIFEROL) 1.25 MG (50000 UNIT) PO CAPS: 50000.0000 [IU] | ORAL_CAPSULE | ORAL | 0 refills | Status: DC

## 2023-11-10 NOTE — Progress Notes (Signed)
Slightly elevated B12. If he is taking a supplement, can reduce intake, but not concerning. Slightly elevated potassium. Would like patient to come in the next 2-4 weeks for repeat CMP. Vitamin D level is low. I have sent in a weekly supplement to take for the next 12 weeks. After that, take a daily OTC vitamin D supplement with 1000-2000 IU.

## 2023-11-10 NOTE — Addendum Note (Signed)
Addended by: Neale Burly on: 11/10/2023 08:39 AM   Modules accepted: Orders

## 2023-11-11 IMAGING — US US ABDOMEN LIMITED
1 series · 14 of 25 positions shown · non-contrast
Comparison: CT 11/28/2016 from [HOSPITAL].

CLINICAL DATA: Right upper quadrant abdominal pain. History of
hepatitis C.

EXAM:
ULTRASOUND ABDOMEN LIMITED RIGHT UPPER QUADRANT

[Series 1: us abdomen limited ruq (liver/gb) · 14 of 110 slices shown]
[im 1/110]
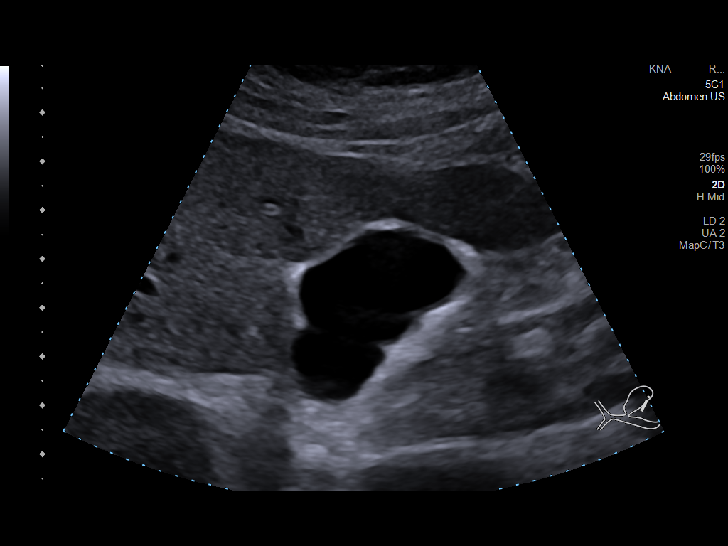
[im 10/110]
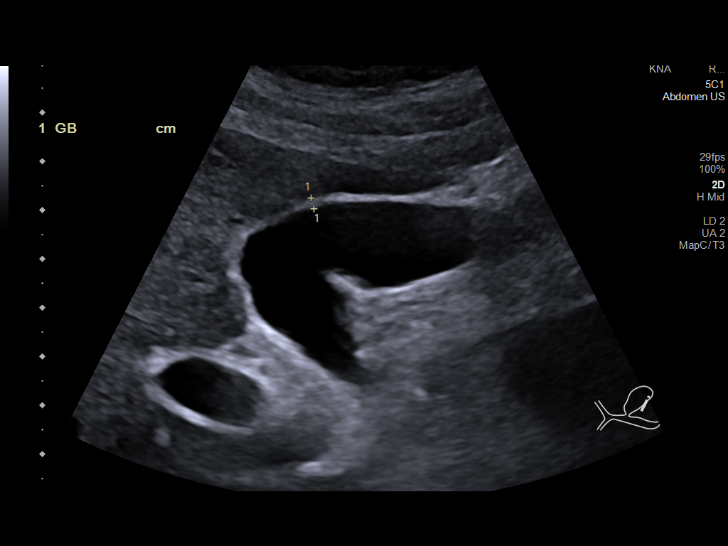
[im 19/110]
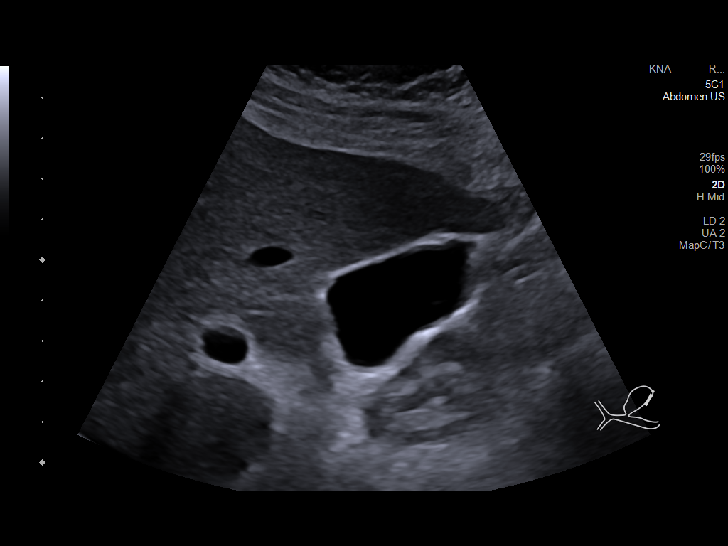
[im 28/110]
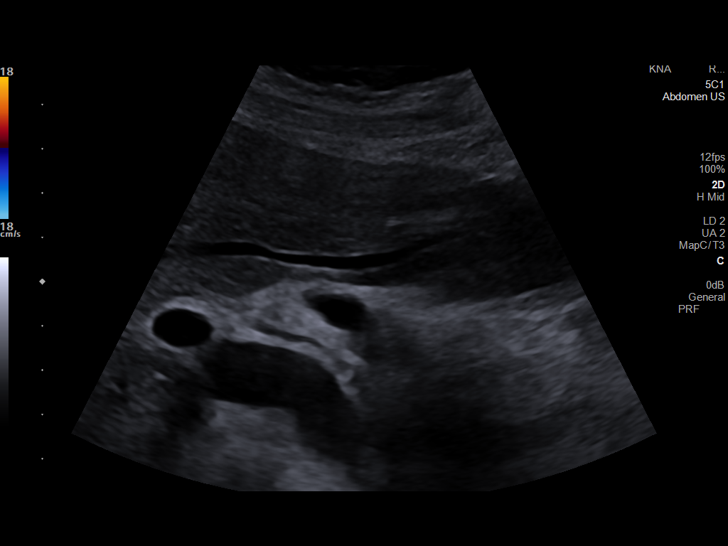
[im 37/110]
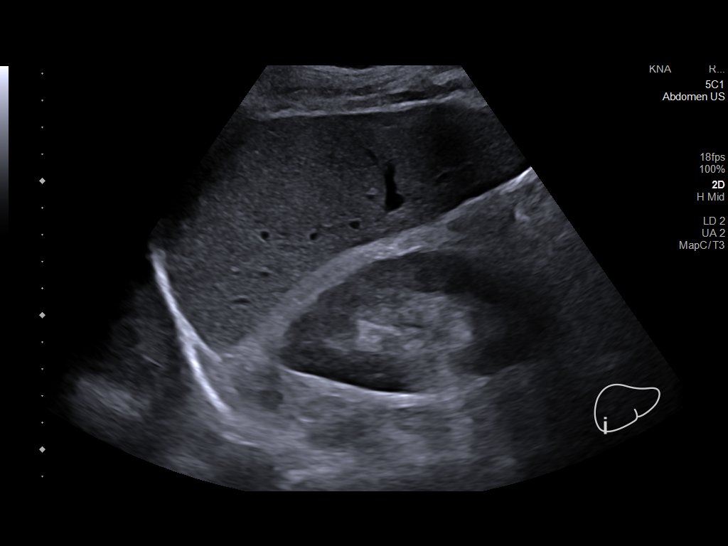
[im 41/110]
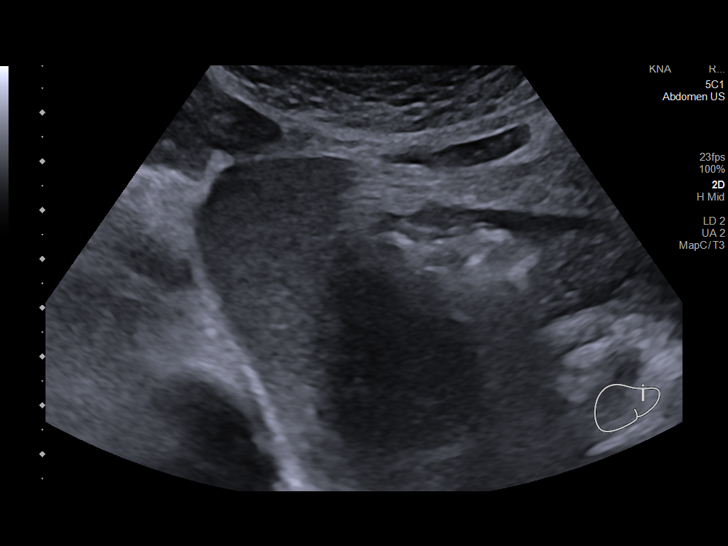
[im 50/110]
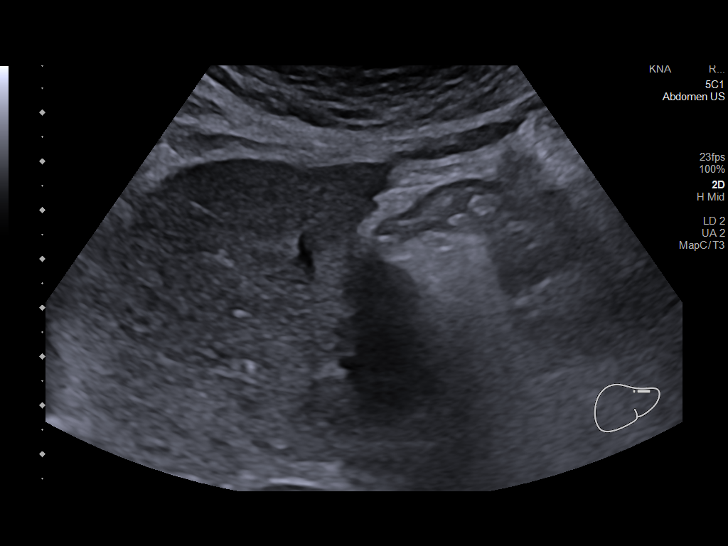
[im 60/110]
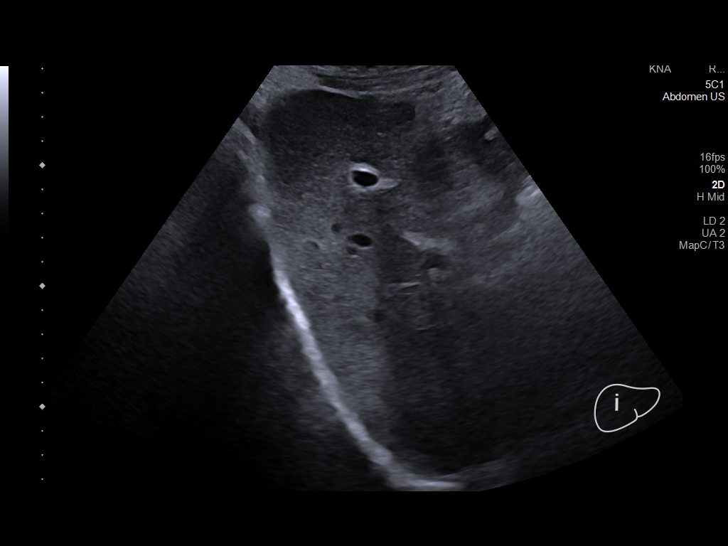
[im 69/110]
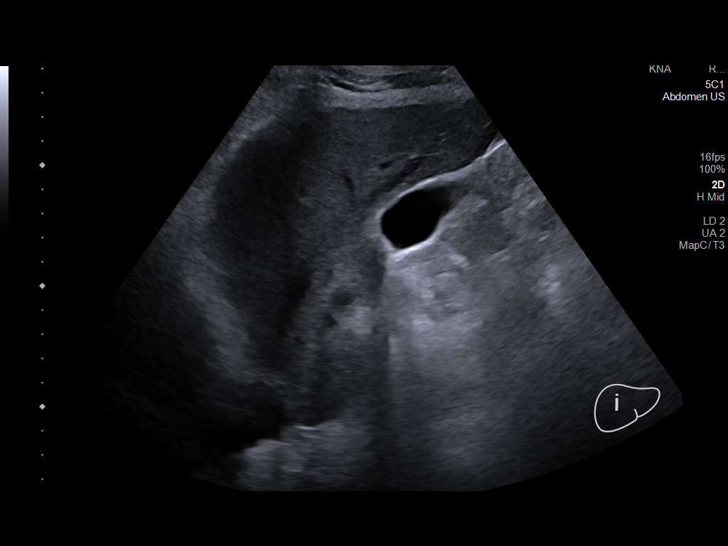
[im 73/110]
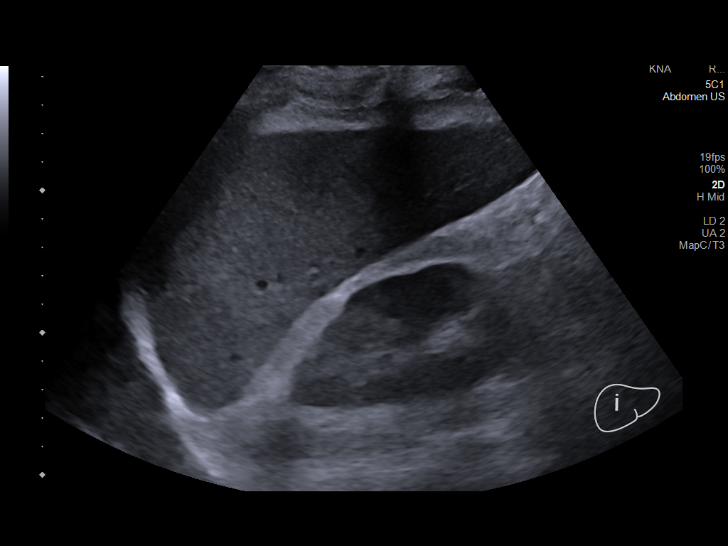
[im 82/110]
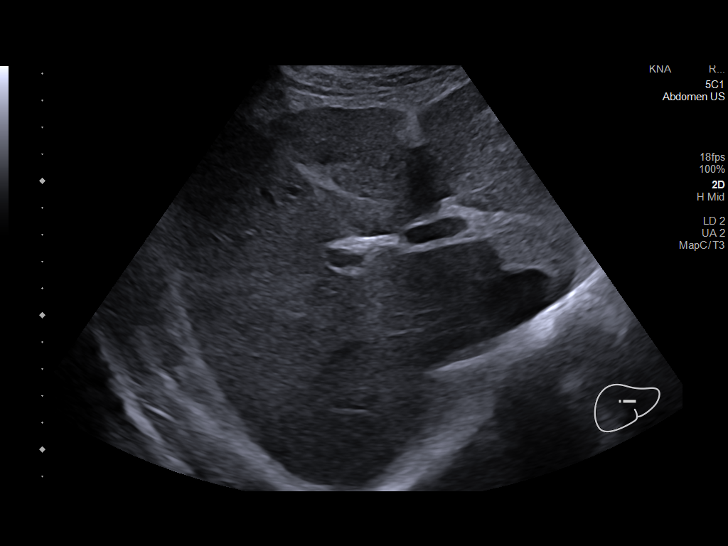
[im 91/110]
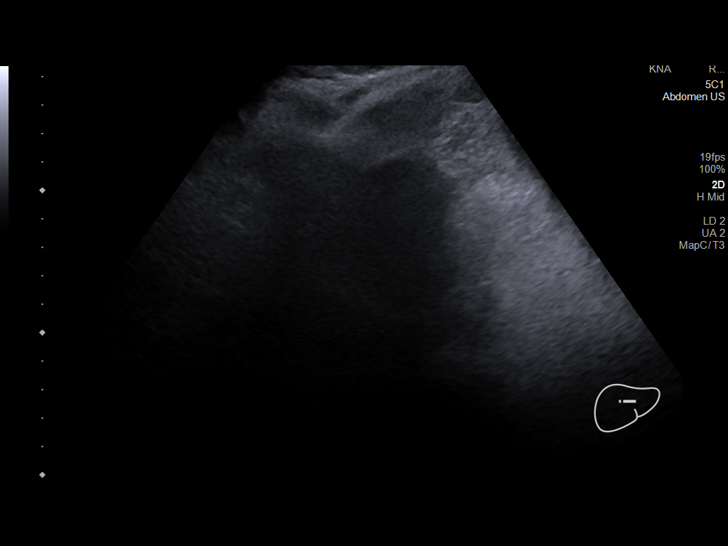
[im 100/110]
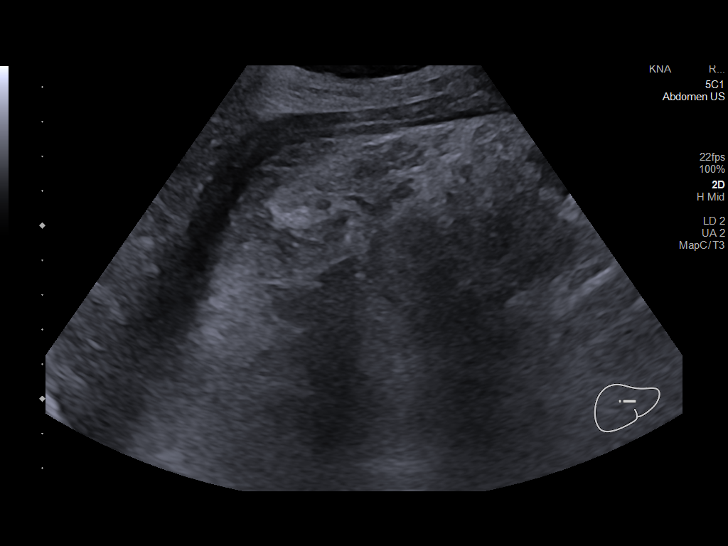
[im 110/110]
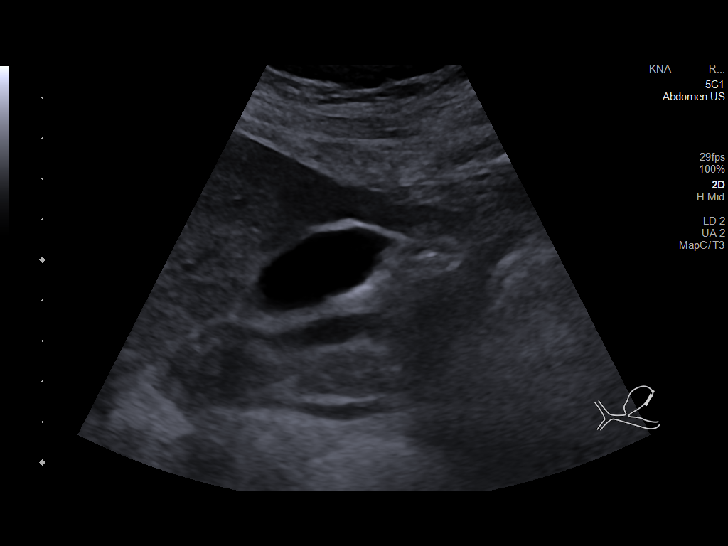

[14 of 25 positions shown; findings below may reference images not displayed]

FINDINGS: Gallbladder:

No gallstones or wall thickening visualized. No sonographic Murphy
sign noted by sonographer.

Common bile duct:

Diameter: Normal, 3 mm.

Liver:

No focal lesion identified. Within normal limits in parenchymal
echogenicity. Portal vein is patent on color Doppler imaging with
normal direction of blood flow towards the liver.

Other: None.
IMPRESSION: No acute process or explanation for right upper quadrant pain.

## 2023-11-29 DIAGNOSIS — R42 Dizziness and giddiness: Secondary | ICD-10-CM | POA: Diagnosis not present

## 2023-11-29 DIAGNOSIS — R531 Weakness: Secondary | ICD-10-CM | POA: Diagnosis not present

## 2023-12-01 ENCOUNTER — Other Ambulatory Visit (HOSPITAL_BASED_OUTPATIENT_CLINIC_OR_DEPARTMENT_OTHER): Payer: Self-pay

## 2023-12-01 MED ORDER — HYDROMORPHONE HCL 4 MG PO TABS
4.0000 mg | ORAL_TABLET | Freq: Four times a day (QID) | ORAL | 0 refills | Status: DC | PRN
  Filled 2023-12-01: qty 120, 30d supply, fill #0

## 2023-12-04 ENCOUNTER — Other Ambulatory Visit (HOSPITAL_BASED_OUTPATIENT_CLINIC_OR_DEPARTMENT_OTHER): Payer: Self-pay

## 2023-12-04 DIAGNOSIS — Z79891 Long term (current) use of opiate analgesic: Secondary | ICD-10-CM | POA: Diagnosis not present

## 2023-12-04 DIAGNOSIS — G894 Chronic pain syndrome: Secondary | ICD-10-CM | POA: Diagnosis not present

## 2023-12-04 DIAGNOSIS — M961 Postlaminectomy syndrome, not elsewhere classified: Secondary | ICD-10-CM | POA: Diagnosis not present

## 2023-12-04 DIAGNOSIS — M47812 Spondylosis without myelopathy or radiculopathy, cervical region: Secondary | ICD-10-CM | POA: Diagnosis not present

## 2023-12-04 MED ORDER — HYDROMORPHONE HCL 4 MG PO TABS
4.0000 mg | ORAL_TABLET | Freq: Four times a day (QID) | ORAL | 0 refills | Status: DC | PRN
  Filled 2023-12-29: qty 120, 30d supply, fill #0

## 2023-12-05 ENCOUNTER — Encounter: Payer: Self-pay | Admitting: Family Medicine

## 2023-12-05 NOTE — Progress Notes (Signed)
 Ectopic beats, recommend patient avoid stimulants such as caffeine and amphetamines. Recommend patient complete repeat CMP that was ordered to monitor potassium. Follow up with PCP if symptoms continue, may need referral to Cardiology.

## 2023-12-28 ENCOUNTER — Other Ambulatory Visit (HOSPITAL_BASED_OUTPATIENT_CLINIC_OR_DEPARTMENT_OTHER): Payer: Self-pay

## 2023-12-29 ENCOUNTER — Other Ambulatory Visit (HOSPITAL_BASED_OUTPATIENT_CLINIC_OR_DEPARTMENT_OTHER): Payer: Self-pay

## 2024-01-11 ENCOUNTER — Ambulatory Visit: Admitting: Family Medicine

## 2024-01-11 ENCOUNTER — Encounter: Payer: Self-pay | Admitting: Family Medicine

## 2024-01-11 VITALS — BP 128/80 | HR 49 | Ht 71.0 in | Wt 187.0 lb

## 2024-01-11 DIAGNOSIS — Z122 Encounter for screening for malignant neoplasm of respiratory organs: Secondary | ICD-10-CM

## 2024-01-11 DIAGNOSIS — Z1211 Encounter for screening for malignant neoplasm of colon: Secondary | ICD-10-CM

## 2024-01-11 DIAGNOSIS — Z0000 Encounter for general adult medical examination without abnormal findings: Secondary | ICD-10-CM | POA: Diagnosis not present

## 2024-01-11 NOTE — Progress Notes (Signed)
 BP 128/80   Pulse (!) 49   Ht 5\' 11"  (1.803 m)   Wt 187 lb (84.8 kg)   SpO2 100%   BMI 26.08 kg/m    Subjective:   Patient ID: Shawn Mins., male    DOB: [birth year], 63 y.o.   MRN: [id number]  HPI: Waddell Iten. is a 63 y.o. male presenting on 01/11/2024 for Medical Management of Chronic Issues   HPI Physical exam Patient is coming in today for physical exam and recheck of chronic medical issues.  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  Patient is still a smoker and smokes just less than 1 pack/day for about a 40-pack-year history.  Will do lung cancer screening today.  He denies any shortness of breath.    Patient will call the office again for colon cancer screening at the GI doctor  Relevant past medical, surgical, family and social history reviewed and updated as indicated. Interim medical history since our last visit reviewed. Allergies and medications reviewed and updated.  Review of Systems  Constitutional:  Negative for chills and fever.  Eyes:  Negative for visual disturbance.  Respiratory:  Negative for shortness of breath and wheezing.   Cardiovascular:  Negative for chest pain and leg swelling.  Musculoskeletal:  Positive for arthralgias and back pain. Negative for gait problem.  Skin:  Negative for rash.  Neurological:  Negative for dizziness, weakness and numbness.  All other systems reviewed and are negative.   Per HPI unless specifically indicated above   Allergies as of 01/11/2024   No Known Allergies      Medication List        Accurate as of January 11, 2024  2:42 PM. If you have any questions, ask your nurse or doctor.          STOP taking these medications    Vitamin D (Ergocalciferol) 1.25 MG (50000 UNIT) Caps capsule Commonly known as: DRISDOL Stopped by: Elige Radon Marcellius Montagna       TAKE these medications    HYDROmorphone 4 MG tablet Commonly  known as: DILAUDID Take 1 tablet (4 mg total) by mouth 4 (four) times daily as needed.   HYDROmorphone 4 MG tablet Commonly known as: DILAUDID Take 1 tablet (4 mg total) by mouth 4 (four) times daily. Stop hydromorphone er   HYDROmorphone 4 MG tablet Commonly known as: DILAUDID Take 1 tablet (4 mg total) by mouth 4 (four) times daily as needed for pain.   HYDROmorphone 4 MG tablet Commonly known as: DILAUDID Take 1 tablet (4 mg total) by mouth 4 (four) times daily as needed for pain   HYDROmorphone 4 MG tablet Commonly known as: DILAUDID Take 1 tablet (4 mg total) by mouth 4 (four) times daily as needed for pain   HYDROmorphone 4 MG tablet Commonly known as: DILAUDID Take 1 tablet (4 mg total) by mouth 4 (four) times daily as needed for pain.   HYDROmorphone 4 MG tablet Commonly known as: DILAUDID Take 1 tablet (4 mg total) by mouth 4 (four) times daily as needed.   HYDROmorphone 4 MG tablet Commonly known as: DILAUDID Take 1 tablet (4 mg total) by mouth 4 (four) times daily as needed for pain.   HYDROmorphone 4 MG tablet Commonly known as: DILAUDID Take 1 tablet (4 mg total) by mouth 4 (four) times daily as needed.  Objective:   BP 128/80   Pulse (!) 49   Ht 5\' 11"  (1.803 m)   Wt 187 lb (84.8 kg)   SpO2 100%   BMI 26.08 kg/m   Wt Readings from Last 3 Encounters:  01/11/24 187 lb (84.8 kg)  11/09/23 197 lb (89.4 kg)  02/08/23 193 lb (87.5 kg)    Physical Exam Vitals and nursing note reviewed.  Constitutional:      General: He is not in acute distress.    Appearance: He is well-developed. He is not diaphoretic.  HENT:     Right Ear: External ear normal.     Left Ear: External ear normal.     Nose: Nose normal.     Mouth/Throat:     Pharynx: No oropharyngeal exudate.  Eyes:     General: No scleral icterus.    Conjunctiva/sclera: Conjunctivae normal.  Neck:     Thyroid: No thyromegaly.  Cardiovascular:     Rate and Rhythm: Normal rate and  regular rhythm.     Heart sounds: Normal heart sounds. No murmur heard. Pulmonary:     Effort: Pulmonary effort is normal. No respiratory distress.     Breath sounds: Normal breath sounds. No wheezing.  Abdominal:     General: Abdomen is flat. Bowel sounds are normal. There is no distension.     Palpations: Abdomen is soft.     Tenderness: There is no abdominal tenderness. There is no guarding or rebound.  Musculoskeletal:        General: No swelling. Normal range of motion.     Cervical back: Neck supple.  Lymphadenopathy:     Cervical: No cervical adenopathy.  Skin:    General: Skin is warm and dry.     Findings: No rash.  Neurological:     Mental Status: He is alert and oriented to person, place, and time.     Coordination: Coordination normal.  Psychiatric:        Behavior: Behavior normal.       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  Counseling provided for all of the vaccine components Orders Placed This Encounter  Procedures   CBC with Differential/Platelet   CMP14+EGFR   Lipid panel   Ambulatory Referral Lung Cancer Screening [facility]    Arville Care, MD [facility] 01/11/2024, 2:42 PM

## 2024-01-12 LAB — CMP14+EGFR
ALT: 21 IU/L (ref 0–44)
AST: 23 IU/L (ref 0–40)
Albumin: 4.3 g/dL (ref 3.9–4.9)
Alkaline Phosphatase: 64 IU/L (ref 44–121)
BUN/Creatinine Ratio: 16 (ref 10–24)
BUN: 16 mg/dL (ref 8–27)
Bilirubin Total: 0.3 mg/dL (ref 0.0–1.2)
CO2: 25 mmol/L (ref 20–29)
Calcium: 9.4 mg/dL (ref 8.6–10.2)
Chloride: 102 mmol/L (ref 96–106)
Creatinine, Ser: 1 mg/dL (ref 0.76–1.27)
Globulin, Total: 2.6 g/dL (ref 1.5–4.5)
Glucose: 79 mg/dL (ref 70–99)
Potassium: 5.4 mmol/L — ABNORMAL HIGH (ref 3.5–5.2)
Sodium: 139 mmol/L (ref 134–144)
Total Protein: 6.9 g/dL (ref 6.0–8.5)
eGFR: 85 mL/min/{1.73_m2} (ref >59)

## 2024-01-12 LAB — CBC WITH DIFFERENTIAL/PLATELET
Basophils Absolute: 0 10*3/uL (ref 0.0–0.2)
Basos: 1 %
EOS (ABSOLUTE): 0.1 10*3/uL (ref 0.0–0.4)
Eos: 3 %
Hematocrit: 43.4 % (ref 37.5–51.0)
Hemoglobin: 14.5 g/dL (ref 13.0–17.7)
Immature Grans (Abs): 0 10*3/uL (ref 0.0–0.1)
Immature Granulocytes: 0 %
Lymphocytes Absolute: 1.7 10*3/uL (ref 0.7–3.1)
Lymphs: 35 %
MCH: 32.4 pg (ref 26.6–33.0)
MCHC: 33.4 g/dL (ref 31.5–35.7)
MCV: 97 fL (ref 79–97)
Monocytes Absolute: 0.4 10*3/uL (ref 0.1–0.9)
Monocytes: 9 %
Neutrophils Absolute: 2.6 10*3/uL (ref 1.4–7.0)
Neutrophils: 52 %
Platelets: 220 10*3/uL (ref 150–450)
RBC: 4.48 x10E6/uL (ref 4.14–5.80)
RDW: 12.7 % (ref 11.6–15.4)
WBC: 5 10*3/uL (ref 3.4–10.8)

## 2024-01-12 LAB — LIPID PANEL
Chol/HDL Ratio: 3 ratio (ref 0.0–5.0)
Cholesterol, Total: 192 mg/dL (ref 100–199)
HDL: 64 mg/dL (ref >39)
LDL Chol Calc (NIH): 116 mg/dL — ABNORMAL HIGH (ref 0–99)
Triglycerides: 63 mg/dL (ref 0–149)
VLDL Cholesterol Cal: 12 mg/dL (ref 5–40)

## 2024-01-15 ENCOUNTER — Encounter: Payer: Self-pay | Admitting: Family Medicine

## 2024-01-25 ENCOUNTER — Telehealth: Payer: Self-pay | Admitting: *Deleted

## 2024-01-25 ENCOUNTER — Other Ambulatory Visit (HOSPITAL_BASED_OUTPATIENT_CLINIC_OR_DEPARTMENT_OTHER): Payer: Self-pay

## 2024-01-25 ENCOUNTER — Other Ambulatory Visit: Payer: Self-pay | Admitting: *Deleted

## 2024-01-25 DIAGNOSIS — Z122 Encounter for screening for malignant neoplasm of respiratory organs: Secondary | ICD-10-CM

## 2024-01-25 DIAGNOSIS — F17210 Nicotine dependence, cigarettes, uncomplicated: Secondary | ICD-10-CM

## 2024-01-25 DIAGNOSIS — Z87891 Personal history of nicotine dependence: Secondary | ICD-10-CM

## 2024-01-25 MED ORDER — HYDROMORPHONE HCL 4 MG PO TABS
4.0000 mg | ORAL_TABLET | Freq: Four times a day (QID) | ORAL | 0 refills | Status: DC | PRN
  Filled 2024-01-25 – 2024-01-26 (×3): qty 120, 30d supply, fill #0

## 2024-01-25 NOTE — Telephone Encounter (Signed)
 Lung Cancer Screening Narrative/Criteria Questionnaire (Cigarette Smokers Only- No Cigars/Pipes/vapes)   Shawn Russell.   SDMV:03/04/24 11:15- [place]                                           [birth year]              LDCT: 03/04/24 2:00- [facility]    63 y.o.   Phone: [phone]  Lung Screening Narrative (confirm age 68-77 yrs Medicare / 50-80 yrs Private pay insurance)   Insurance information:HTA   Referring Provider:Dettinger   This screening involves an initial phone call with a team member from our program. It is called a shared decision making visit. The initial meeting is required by insurance and Medicare to make sure you understand the program. This appointment takes about 15-20 minutes to complete. The CT scan will completed at a separate date/time. This scan takes about 5-10 minutes to complete and you may eat and drink before and after the scan.  Criteria questions for Lung Cancer Screening:   Are you a current or former smoker? Current Age began smoking: 55   If you are a former smoker, what year did you quit smoking? (within 15 yrs)   To calculate your smoking history, I need an accurate estimate of how many packs of cigarettes you smoked per day and for how many years. (Not just the number of PPD you are now smoking)   Years smoking 47 x Packs per day 1/2- 3/4 = Pack years 30   (at least 20 pack yrs)   (Make sure they understand that we need to know how much they have smoked in the past, not just the number of PPD they are smoking now)  Do you have a personal history of cancer?  No    Do you have a family history of cancer? No  Are you coughing up blood?  No  Have you had unexplained weight loss of 15 lbs or more in the last 6 months? No  It looks like you meet all criteria.     Additional information: N/A

## 2024-01-26 ENCOUNTER — Other Ambulatory Visit (HOSPITAL_BASED_OUTPATIENT_CLINIC_OR_DEPARTMENT_OTHER): Payer: Self-pay

## 2024-01-26 ENCOUNTER — Other Ambulatory Visit: Payer: Self-pay

## 2024-01-30 ENCOUNTER — Other Ambulatory Visit (HOSPITAL_BASED_OUTPATIENT_CLINIC_OR_DEPARTMENT_OTHER): Payer: Self-pay

## 2024-01-30 DIAGNOSIS — Z79891 Long term (current) use of opiate analgesic: Secondary | ICD-10-CM | POA: Diagnosis not present

## 2024-01-30 DIAGNOSIS — G894 Chronic pain syndrome: Secondary | ICD-10-CM | POA: Diagnosis not present

## 2024-01-30 DIAGNOSIS — M961 Postlaminectomy syndrome, not elsewhere classified: Secondary | ICD-10-CM | POA: Diagnosis not present

## 2024-01-30 DIAGNOSIS — M47812 Spondylosis without myelopathy or radiculopathy, cervical region: Secondary | ICD-10-CM | POA: Diagnosis not present

## 2024-01-30 MED ORDER — HYDROMORPHONE HCL 4 MG PO TABS
4.0000 mg | ORAL_TABLET | Freq: Four times a day (QID) | ORAL | 0 refills | Status: AC | PRN
  Filled 2024-04-20: qty 120, 30d supply, fill #0

## 2024-01-30 MED ORDER — HYDROMORPHONE HCL 4 MG PO TABS
4.0000 mg | ORAL_TABLET | Freq: Four times a day (QID) | ORAL | 0 refills | Status: DC | PRN
Start: 2024-02-23 — End: 2024-04-17
  Filled 2024-02-23: qty 120, 30d supply, fill #0

## 2024-02-01 ENCOUNTER — Other Ambulatory Visit: Payer: Self-pay

## 2024-02-01 DIAGNOSIS — Z1211 Encounter for screening for malignant neoplasm of colon: Secondary | ICD-10-CM

## 2024-02-21 ENCOUNTER — Encounter: Payer: Self-pay | Admitting: *Deleted

## 2024-02-22 ENCOUNTER — Other Ambulatory Visit (HOSPITAL_BASED_OUTPATIENT_CLINIC_OR_DEPARTMENT_OTHER): Payer: Self-pay

## 2024-02-23 ENCOUNTER — Other Ambulatory Visit: Payer: Self-pay

## 2024-02-23 ENCOUNTER — Other Ambulatory Visit (HOSPITAL_BASED_OUTPATIENT_CLINIC_OR_DEPARTMENT_OTHER): Payer: Self-pay

## 2024-03-04 ENCOUNTER — Ambulatory Visit: Admitting: Adult Health

## 2024-03-04 ENCOUNTER — Ambulatory Visit (HOSPITAL_BASED_OUTPATIENT_CLINIC_OR_DEPARTMENT_OTHER)
Admission: RE | Admit: 2024-03-04 | Discharge: 2024-03-04 | Disposition: A | Discharge status: Home / Self Care | Source: Ambulatory Visit | Attending: Acute Care | Admitting: Acute Care

## 2024-03-04 ENCOUNTER — Encounter: Payer: Self-pay | Admitting: Adult Health

## 2024-03-04 DIAGNOSIS — Z87891 Personal history of nicotine dependence: Secondary | ICD-10-CM | POA: Insufficient documentation

## 2024-03-04 DIAGNOSIS — Z122 Encounter for screening for malignant neoplasm of respiratory organs: Secondary | ICD-10-CM | POA: Diagnosis not present

## 2024-03-04 DIAGNOSIS — F17210 Nicotine dependence, cigarettes, uncomplicated: Secondary | ICD-10-CM

## 2024-03-04 NOTE — Patient Instructions (Signed)

## 2024-03-04 NOTE — Progress Notes (Signed)
  Virtual Visit via Telephone Note  I connected with Shawn Russell. , 03/04/24 11:23 AM by a telemedicine application and verified that I am speaking with the correct person using two identifiers.  Location: Patient: home Provider: home   I discussed the limitations of evaluation and management by telemedicine and the availability of in person appointments. The patient expressed understanding and agreed to proceed.   Shared Decision Making Visit Lung Cancer Screening Program [phone])   Eligibility: 63 y.o. Pack Years Smoking History Calculation = 30 pack years  (# packs/per year x # years smoked) Recent History of coughing up blood  no Unexplained weight loss? no ( >Than 15 pounds within the last 6 months ) Prior History Lung / other cancer no (Diagnosis within the last 5 years already requiring surveillance chest CT Scans). Smoking Status Current Smoker   Visit Components: Discussion included one or more decision making aids. YES Discussion included risk/benefits of screening. YES Discussion included potential follow up diagnostic testing for abnormal scans. YES Discussion included meaning and risk of over diagnosis. YES Discussion included meaning and risk of False Positives. YES Discussion included meaning of total radiation exposure. YES  Counseling Included: Importance of adherence to annual lung cancer LDCT screening. YES Impact of comorbidities on ability to participate in the program. YES Ability and willingness to under diagnostic treatment. YES  Smoking Cessation Counseling: Current Smokers:  Discussed importance of smoking cessation. yes Information about tobacco cessation classes and interventions provided to patient. yes Patient provided with "ticket" for LDCT Scan. yes Symptomatic Patient. NO Diagnosis Code: Tobacco Use Z72.0 Asymptomatic Patient yes  Counseling - 4 minutes of smoking cessation counseling (CT Chest Lung Cancer Screening Low Dose W/O  CM) [id number]  Z12.2-Screening of respiratory organs Z87.891-Personal history of nicotine dependence   [name] 03/04/24

## 2024-03-21 ENCOUNTER — Other Ambulatory Visit (HOSPITAL_BASED_OUTPATIENT_CLINIC_OR_DEPARTMENT_OTHER): Payer: Self-pay

## 2024-03-21 MED ORDER — HYDROMORPHONE HCL 4 MG PO TABS
4.0000 mg | ORAL_TABLET | Freq: Four times a day (QID) | ORAL | 0 refills | Status: AC | PRN
  Filled 2024-03-21 – 2024-03-22 (×3): qty 120, 30d supply, fill #0

## 2024-03-22 ENCOUNTER — Other Ambulatory Visit: Payer: Self-pay | Admitting: Acute Care

## 2024-03-22 ENCOUNTER — Other Ambulatory Visit: Payer: Self-pay

## 2024-03-22 ENCOUNTER — Other Ambulatory Visit (HOSPITAL_COMMUNITY): Payer: Self-pay

## 2024-03-22 ENCOUNTER — Other Ambulatory Visit (HOSPITAL_BASED_OUTPATIENT_CLINIC_OR_DEPARTMENT_OTHER): Payer: Self-pay

## 2024-03-22 DIAGNOSIS — F17210 Nicotine dependence, cigarettes, uncomplicated: Secondary | ICD-10-CM

## 2024-03-22 DIAGNOSIS — Z122 Encounter for screening for malignant neoplasm of respiratory organs: Secondary | ICD-10-CM

## 2024-03-22 DIAGNOSIS — Z87891 Personal history of nicotine dependence: Secondary | ICD-10-CM

## 2024-03-26 ENCOUNTER — Telehealth: Payer: Self-pay

## 2024-03-26 NOTE — Telephone Encounter (Signed)
-----   Message from Lucio Sabin Dettinger sent at 03/22/2024  1:56 PM EDT ----- Please let the patient know that his CT for lung cancer screening that showed a small nodule that looks benign, just repeat the scan in 1 year, it also showed a 4.2 cm aneurysm which is within a normal range for aneurysms that they do not need to do anything about but we will repeat that scan again in 1 year as well. ----- Message ----- From: Michela Aguas, RN Sent: 03/22/2024   1:28 PM EDT To: Lucio Sabin Dettinger, MD

## 2024-03-26 NOTE — Telephone Encounter (Signed)
 Pt is aware of results and recommendations. Pt questioned where the aneurysm is located. Informed that it is an aortic aneurysm and that it does need follow up. Advised to exercise, eat healthy ( low cholesterol foods) to help prevent the aneurysm to worsen. Pt understood and had no further questions.

## 2024-03-27 ENCOUNTER — Ambulatory Visit: Admitting: Gastroenterology

## 2024-04-01 ENCOUNTER — Other Ambulatory Visit (HOSPITAL_BASED_OUTPATIENT_CLINIC_OR_DEPARTMENT_OTHER): Payer: Self-pay

## 2024-04-01 DIAGNOSIS — Z79891 Long term (current) use of opiate analgesic: Secondary | ICD-10-CM | POA: Diagnosis not present

## 2024-04-01 DIAGNOSIS — G894 Chronic pain syndrome: Secondary | ICD-10-CM | POA: Diagnosis not present

## 2024-04-01 DIAGNOSIS — M47812 Spondylosis without myelopathy or radiculopathy, cervical region: Secondary | ICD-10-CM | POA: Diagnosis not present

## 2024-04-01 DIAGNOSIS — M961 Postlaminectomy syndrome, not elsewhere classified: Secondary | ICD-10-CM | POA: Diagnosis not present

## 2024-04-01 MED ORDER — HYDROMORPHONE HCL 4 MG PO TABS
4.0000 mg | ORAL_TABLET | Freq: Four times a day (QID) | ORAL | 0 refills | Status: AC | PRN
  Filled 2024-09-09: qty 120, 30d supply, fill #0

## 2024-04-01 MED ORDER — HYDROMORPHONE HCL 4 MG PO TABS
4.0000 mg | ORAL_TABLET | Freq: Four times a day (QID) | ORAL | 0 refills | Status: AC | PRN
  Filled 2024-05-18: qty 120, 30d supply, fill #0

## 2024-04-16 DIAGNOSIS — N5201 Erectile dysfunction due to arterial insufficiency: Secondary | ICD-10-CM | POA: Diagnosis not present

## 2024-04-16 DIAGNOSIS — E230 Hypopituitarism: Secondary | ICD-10-CM | POA: Diagnosis not present

## 2024-04-16 DIAGNOSIS — R3121 Asymptomatic microscopic hematuria: Secondary | ICD-10-CM | POA: Diagnosis not present

## 2024-04-16 DIAGNOSIS — Z87442 Personal history of urinary calculi: Secondary | ICD-10-CM | POA: Diagnosis not present

## 2024-04-17 ENCOUNTER — Other Ambulatory Visit (HOSPITAL_BASED_OUTPATIENT_CLINIC_OR_DEPARTMENT_OTHER): Payer: Self-pay

## 2024-04-17 ENCOUNTER — Ambulatory Visit: Admitting: Gastroenterology

## 2024-04-17 ENCOUNTER — Encounter: Payer: Self-pay | Admitting: Gastroenterology

## 2024-04-17 VITALS — BP 120/77 | HR 66 | Temp 98.1°F | Ht 71.0 in | Wt 189.2 lb

## 2024-04-17 DIAGNOSIS — K5903 Drug induced constipation: Secondary | ICD-10-CM | POA: Diagnosis not present

## 2024-04-17 DIAGNOSIS — T402X5A Adverse effect of other opioids, initial encounter: Secondary | ICD-10-CM | POA: Diagnosis not present

## 2024-04-17 DIAGNOSIS — Z1211 Encounter for screening for malignant neoplasm of colon: Secondary | ICD-10-CM | POA: Insufficient documentation

## 2024-04-17 NOTE — Progress Notes (Signed)
 GI Office Note    Referring Provider: Dettinger, Fonda LABOR, MD Primary Care Physician:  Dettinger, Fonda LABOR, MD  Primary Gastroenterologist:  Chief Complaint   Chief Complaint  Patient presents with   Colonoscopy     History of Present Illness   Shawn Federici. is a 63 y.o. male presenting today to schedule screening colonoscopy. Ov made due to constipation.  Patient has chronic opioid-induced constipation for years. Had the same at time of colonoscopy 10 years ago. He manages his constipation with one capful of miralax at bedtime. Generally has BM every day. Mostly normal stool. No melena, brbpr. Denies abdominal pain. Occasional heartburn. No dysphagia. No N/V. No unintentional weight loss. Followed by pulmonology for lung cancer screening. PCP aware of dilated thoracic aorta and atherosclerosis.   CT A/P with contrast 01/2023: IMPRESSION: -Normal appendix. -Punctate right nephrolithiasis, unchanged. No ureteral stones or hydronephrosis. -Aortic atherosclerosis. -No acute findings.  CT chest 03/14/24: IMPRESSION: 1. Lung-RADS 2, benign appearance or behavior. Continue annual screening with low-dose chest CT without contrast in 12 months. 2. Dilated 4.2 cm ascending thoracic aorta, which can be reassessed on follow-up screening chest CT in 12 months. 3. One vessel coronary atherosclerosis. 4. Aortic Atherosclerosis (ICD10-I70.0) and Emphysema (ICD10-J43.9).  EGD 2014: -schatzki's ring dilated -hiatal hernia -abnormal gastric mucosa s/p bx, no h.pylori  Colonoscopy 2014: -rectal and colon polyps, hyperplastic   Medications   Current Outpatient Medications  Medication Sig Dispense Refill   HYDROmorphone  (DILAUDID ) 4 MG tablet Take 1 tablet (4 mg total) by mouth 4 (four) times daily as needed for pain. (03-22-24) 120 tablet 0   HYDROmorphone  (DILAUDID ) 4 MG tablet Take 1 tablet (4 mg total) by mouth 4 (four) times daily as needed for pain 120 tablet 0   [START  ON 05/17/2024] HYDROmorphone  (DILAUDID ) 4 MG tablet Take 1 tablet (4 mg total) by mouth 4 (four) times daily as needed for pain. 120 tablet 0   [START ON 04/19/2024] HYDROmorphone  (DILAUDID ) 4 MG tablet Take 1 tablet (4 mg total) by mouth 4 (four) times daily as needed for pain. 120 tablet 0   polyethylene glycol powder (GLYCOLAX/MIRALAX) 17 GM/SCOOP powder Take 17 g by mouth once.     No current facility-administered medications for this visit.    Allergies   Allergies as of 04/17/2024   (No Known Allergies)    Past Medical History   Past Medical History:  Diagnosis Date   Chronic back pain    Chronic constipation    Chronic hepatitis C without hepatic coma (HCC)    blood transfusion 1988;   per pt was treated with mediation approx. 2010 was followed by liver clinic @ [facility] and has been cured   COPD (chronic obstructive pulmonary disease) (HCC)    Degenerative disc disease, lumbar    Depression    ED (erectile dysfunction)    GERD (gastroesophageal reflux disease)    Hearing loss of both ears    per pt has hearing aids but does not wear   History of esophageal dilatation 2014   History of hypogonadism    History of kidney stones    History of TIAs    per pt approx. 2004   OA (osteoarthritis)    knees   Renal cyst, left    Spinal stenosis     Past Surgical History   Past Surgical History:  Procedure Laterality Date   ANKLE SURGERY Right 1990   ORIF right ankle fracture;  retained hardware  COLONOSCOPY WITH PROPOFOL  N/A 02/14/2013   Procedure: COLONOSCOPY WITH PROPOFOL ;  Surgeon: Lamar CHRISTELLA Hollingshead, MD;  Location: [facility];  Service: Endoscopy;  Laterality: N/A;  At cecum:09:16,  cecal withdrawal time:0930, total cecal time:14 minutes   ESOPHAGOGASTRODUODENOSCOPY (EGD) WITH PROPOFOL  N/A 02/14/2013   Procedure: ESOPHAGOGASTRODUODENOSCOPY (EGD) WITH PROPOFOL ;  Surgeon: Lamar CHRISTELLA Hollingshead, MD;  Location: [facility];  Service: Endoscopy;  Laterality: N/A;   kidney stone extraction  1989    ureteroscopy stone extraction   KNEE SURGERY Left 1995   Reconstruction w/ retained hardware   MALONEY DILATION N/A 02/14/2013   Procedure: AGAPITO DILATION;  Surgeon: Lamar CHRISTELLA Hollingshead, MD;  Location: [facility];  Service: Endoscopy;  Laterality: N/A;   POSTERIOR LUMBAR FUSION  10-04-2004   dr jinny. mavis @[facility]    laminectomy and fusion L3 -- L5   POSTERIOR LUMBAR FUSION  06-26-2006   dr jinny. mavis  @[facility]    Re-Do laminectomy L3,  fusion L2 -- L4   ROTATOR CUFF REPAIR Right 2000   SPERMATOCELECTOMY Right 12/13/2018   Procedure: SPERMATOCELECTOMY;  Surgeon: Watt Rush, MD;  Location: [facility];  Service: Urology;  Laterality: Right;   surgery on ears Bilateral x3   as child    Past Family History   Family History  Problem Relation Age of Onset   Heart disease Mother    Osteoarthritis Mother    Heart disease Father    Colon cancer Neg Hx    Liver disease Neg Hx     Past Social History   Social History   Socioeconomic History   Marital status: Legally Separated    Spouse name: Not on file   Number of children: Not on file   Years of education: Not on file   Highest education level: Not on file  Occupational History   Not on file  Tobacco Use   Smoking status: Every Day    Current packs/day: 1.00    Average packs/day: 1 pack/day for 40.0 years (40.0 ttl pk-yrs)    Types: Cigarettes   Smokeless tobacco: Never  Vaping Use   Vaping status: Never Used  Substance and Sexual Activity   Alcohol use: No   Drug use: No   Sexual activity: Not Currently  Other Topics Concern   Not on file  Social History Narrative   Not on file   Social Drivers of Health   Financial Resource Strain: Not on file  Food Insecurity: Not on file  Transportation Needs: Not on file  Physical Activity: Not on file  Stress: Not on file  Social Connections: Not on file  Intimate Partner Violence: Not on file    Review of Systems   General: Negative for anorexia, weight loss, fever, chills, fatigue,  weakness. Eyes: Negative for vision changes.  ENT: Negative for hoarseness, difficulty swallowing , nasal congestion. CV: Negative for chest pain, angina, palpitations, dyspnea on exertion, peripheral edema.  Respiratory: Negative for dyspnea at rest, dyspnea on exertion, cough, sputum, wheezing.  GI: See history of present illness. GU:  Negative for dysuria, hematuria, urinary incontinence, urinary frequency, nocturnal urination.  FD:rymnwpr knee and back pain.  Derm: Negative for rash or itching.  Neuro: Negative for weakness, abnormal sensation, seizure, frequent headaches, memory loss,  confusion.  Psych: Negative for anxiety, depression, suicidal ideation, hallucinations.  Endo: Negative for unusual weight change.  Heme: Negative for bruising or bleeding. Allergy: Negative for rash or hives.  Physical Exam   BP 120/77 (BP Location: Right Arm, Patient Position: Sitting, Cuff  Size: Normal)   Pulse 66   Temp 98.1 F (36.7 C) (Oral)   Ht 5' 11 (1.803 m)   Wt 189 lb 3.2 oz (85.8 kg)   SpO2 97%   BMI 26.39 kg/m    General: Well-nourished, well-developed in no acute distress.  Head: Normocephalic, atraumatic.   Eyes: Conjunctiva pink, no icterus. Mouth: Oropharyngeal mucosa moist and pink  Neck: Supple without thyromegaly, masses, or lymphadenopathy.  Lungs: Clear to auscultation bilaterally.  Heart: Regular rate and rhythm, no murmurs rubs or gallops.  Abdomen: Bowel sounds are normal, nontender, nondistended, no hepatosplenomegaly or masses,  no abdominal bruits or hernia, no rebound or guarding.   Rectal: not performed Extremities: No lower extremity edema. No clubbing or deformities.  Neuro: Alert and oriented x 4 , grossly normal neurologically.  Skin: Warm and dry, no rash or jaundice.   Psych: Alert and cooperative, normal mood and affect.  Labs   Lab Results  Component Value Date   NA 139 01/11/2024   CL 102 01/11/2024   K 5.4 (H) 01/11/2024   CO2 25  01/11/2024   BUN 16 01/11/2024   CREATININE 1.00 01/11/2024   EGFR 85 01/11/2024   CALCIUM 9.4 01/11/2024   ALBUMIN 4.3 01/11/2024   GLUCOSE 79 01/11/2024   Lab Results  Component Value Date   ALT 21 01/11/2024   AST 23 01/11/2024   ALKPHOS 64 01/11/2024   BILITOT 0.3 01/11/2024   Lab Results  Component Value Date   WBC 5.0 01/11/2024   HGB 14.5 01/11/2024   HCT 43.4 01/11/2024   MCV 97 01/11/2024   PLT 220 01/11/2024   Lab Results  Component Value Date   TSH 1.930 11/09/2023   Lab Results  Component Value Date   IRON 87 11/09/2023   TIBC 297 11/09/2023   FERRITIN 301 11/09/2023   Lab Results  Component Value Date   VITAMINB12 1,389 (H) 11/09/2023   Lab Results  Component Value Date   FOLATE >20.0 11/09/2023    Imaging Studies   No results found.  Assessment/Plan:   Opioid-induced constipation: -managed well with miralax daily -continue current bowel regimen  Screening colonoscopy: -ASA 3, room 1,2 ok -augment prep with bisacodyl 10mg  daily for 3 days before prep -continue daily miralax which has been effective -standard bowel prep to be provided - I have discussed the risks, alternatives, benefits with regards to but not limited to the risk of reaction to medication, bleeding, infection, perforation and the patient is agreeable to proceed. Written consent to be obtained.    Shawn Russell, MHS, PA-C [facility]

## 2024-04-17 NOTE — Patient Instructions (Addendum)
 Colonoscopy to be scheduled.   Patient to continue miralax daily.

## 2024-04-20 ENCOUNTER — Other Ambulatory Visit (HOSPITAL_BASED_OUTPATIENT_CLINIC_OR_DEPARTMENT_OTHER): Payer: Self-pay

## 2024-04-24 ENCOUNTER — Ambulatory Visit (INDEPENDENT_AMBULATORY_CARE_PROVIDER_SITE_OTHER): Admitting: Family Medicine

## 2024-04-24 ENCOUNTER — Encounter: Payer: Self-pay | Admitting: Family Medicine

## 2024-04-24 VITALS — BP 122/79 | HR 73 | Ht 71.0 in | Wt 189.0 lb

## 2024-04-24 DIAGNOSIS — I77819 Aortic ectasia, unspecified site: Secondary | ICD-10-CM

## 2024-04-24 DIAGNOSIS — Z716 Tobacco abuse counseling: Secondary | ICD-10-CM

## 2024-04-24 DIAGNOSIS — I2510 Atherosclerotic heart disease of native coronary artery without angina pectoris: Secondary | ICD-10-CM | POA: Diagnosis not present

## 2024-04-24 MED ORDER — ROSUVASTATIN CALCIUM 10 MG PO TABS
10.0000 mg | ORAL_TABLET | Freq: Every day | ORAL | 3 refills | Status: AC
Start: 2024-04-24 — End: ?

## 2024-04-24 MED ORDER — BUPROPION HCL ER (SR) 150 MG PO TB12: 150.0000 mg | ORAL_TABLET | Freq: Two times a day (BID) | ORAL | 3 refills | Status: DC

## 2024-04-24 NOTE — Progress Notes (Signed)
 BP 122/79   Pulse 73   Ht 5' 11 (1.803 m)   Wt 189 lb (85.7 kg)   SpO2 98%   BMI 26.36 kg/m    Subjective:   Patient ID: Shawn Russell., male    DOB: 01-14-1961, 63 y.o.   MRN: [id number]  HPI: Shawn Russell. is a 63 y.o. male presenting on 04/24/2024 for Discuss results  (Lung)   HPI Follow-up CT scan for lung cancer screening results Couple things were noted on the CT scan for lung cancer screening results, one of the ones that there was right coronary artery atherosclerosis.  Patient denies any chest pain or trouble breathing or shortness of breath.  He denies any chest pressure.  He denies any palpitations. Another thing that was noted was a dilated aorta at 4.2 cm.  Recommended ultrasound or repeat scan in 1 year.  Smoking cessation Patient is coming in today because he wants to stop smoking.  He is trying some over-the-counter nicotine replacement but is just not doing well and he has tried Wellbutrin  in the past and would like to try that again.  Currently smoking close to a pack per day.  Relevant past medical, surgical, family and social history reviewed and updated as indicated. Interim medical history since our last visit reviewed. Allergies and medications reviewed and updated.  Review of Systems  Constitutional:  Negative for chills and fever.  HENT:  Negative for congestion.   Respiratory:  Negative for cough, shortness of breath and wheezing.   Cardiovascular:  Negative for chest pain and leg swelling.  Skin:  Negative for rash.  All other systems reviewed and are negative.   Per HPI unless specifically indicated above   Allergies as of 04/24/2024   No Known Allergies      Medication List        Accurate as of April 24, 2024 10:25 AM. If you have any questions, ask your nurse or doctor.          buPROPion  150 MG 12 hr tablet Commonly known as: Wellbutrin  SR Take 1 tablet (150 mg total) by mouth 2 (two) times daily. Started by: [facility]  Breuna Loveall   HYDROmorphone  4 MG tablet Commonly known as: DILAUDID  Take 1 tablet (4 mg total) by mouth 4 (four) times daily as needed for pain   HYDROmorphone  4 MG tablet Commonly known as: DILAUDID  Take 1 tablet (4 mg total) by mouth 4 (four) times daily as needed for pain. (03-22-24)   HYDROmorphone  4 MG tablet Commonly known as: DILAUDID  Take 1 tablet (4 mg total) by mouth 4 (four) times daily as needed for pain.   HYDROmorphone  4 MG tablet Commonly known as: DILAUDID  Take 1 tablet (4 mg total) by mouth 4 (four) times daily as needed for pain. Start taking on: May 17, 2024   polyethylene glycol powder 17 GM/SCOOP powder Commonly known as: GLYCOLAX/MIRALAX Take 17 g by mouth once.   rosuvastatin  10 MG tablet Commonly known as: Crestor  Take 1 tablet (10 mg total) by mouth daily. Started by: [facility] Lamel Mccarley         Objective:   BP 122/79   Pulse 73   Ht 5' 11 (1.803 m)   Wt 189 lb (85.7 kg)   SpO2 98%   BMI 26.36 kg/m   Wt Readings from Last 3 Encounters:  04/24/24 189 lb (85.7 kg)  04/17/24 189 lb 3.2 oz (85.8 kg)  01/11/24 187 lb (84.8 kg)  Physical Exam Vitals and nursing note reviewed.  Constitutional:      General: He is not in acute distress.    Appearance: He is well-developed. He is not diaphoretic.  Eyes:     General: No scleral icterus.    Conjunctiva/sclera: Conjunctivae normal.  Neck:     Thyroid : No thyromegaly.  Cardiovascular:     Rate and Rhythm: Normal rate and regular rhythm.     Heart sounds: Normal heart sounds. No murmur heard. Pulmonary:     Effort: Pulmonary effort is normal. No respiratory distress.     Breath sounds: Normal breath sounds. No wheezing.  Musculoskeletal:        General: No swelling. Normal range of motion.     Cervical back: Neck supple.  Lymphadenopathy:     Cervical: No cervical adenopathy.  Skin:    General: Skin is warm and dry.     Findings: No rash.  Neurological:     Mental Status: He is alert  and oriented to person, place, and time.     Coordination: Coordination normal.  Psychiatric:        Behavior: Behavior normal.       Assessment & Plan:   Problem List Items Addressed This Visit   None Visit Diagnoses       Atherosclerosis of right coronary artery    -  Primary   Relevant Medications   rosuvastatin  (CRESTOR ) 10 MG tablet   Other Relevant Orders   Ambulatory referral to Cardiology     Aortic dilatation (HCC)       Relevant Medications   rosuvastatin  (CRESTOR ) 10 MG tablet   Other Relevant Orders   Ambulatory referral to Cardiology     Encounter for smoking cessation counseling       Relevant Medications   buPROPion  (WELLBUTRIN  SR) 150 MG 12 hr tablet     Will start Crestor  because of atherosclerosis and also placed referral to cardiology.  Patient will start Wellbutrin  for smoking cessation and recommended that he do at least a few months.  Follow up plan: Return in about 8 months (around 12/23/2024), or if symptoms worsen or fail to improve, for Physical exam recheck cholesterol.  Counseling provided for all of the vaccine components Orders Placed This Encounter  Procedures   Ambulatory referral to Cardiology    Fonda Levins, MD [facility] 04/24/2024, 10:25 AM

## 2024-04-26 ENCOUNTER — Encounter: Payer: Self-pay | Admitting: *Deleted

## 2024-05-15 ENCOUNTER — Ambulatory Visit: Admitting: Family Medicine

## 2024-05-16 ENCOUNTER — Other Ambulatory Visit (HOSPITAL_BASED_OUTPATIENT_CLINIC_OR_DEPARTMENT_OTHER): Payer: Self-pay

## 2024-05-18 ENCOUNTER — Other Ambulatory Visit (HOSPITAL_BASED_OUTPATIENT_CLINIC_OR_DEPARTMENT_OTHER): Payer: Self-pay

## 2024-05-21 ENCOUNTER — Other Ambulatory Visit: Payer: Self-pay | Admitting: Medical Genetics

## 2024-05-27 ENCOUNTER — Other Ambulatory Visit (HOSPITAL_BASED_OUTPATIENT_CLINIC_OR_DEPARTMENT_OTHER): Payer: Self-pay

## 2024-05-27 DIAGNOSIS — M47812 Spondylosis without myelopathy or radiculopathy, cervical region: Secondary | ICD-10-CM | POA: Diagnosis not present

## 2024-05-27 DIAGNOSIS — M961 Postlaminectomy syndrome, not elsewhere classified: Secondary | ICD-10-CM | POA: Diagnosis not present

## 2024-05-27 DIAGNOSIS — Z79891 Long term (current) use of opiate analgesic: Secondary | ICD-10-CM | POA: Diagnosis not present

## 2024-05-27 DIAGNOSIS — G894 Chronic pain syndrome: Secondary | ICD-10-CM | POA: Diagnosis not present

## 2024-05-27 MED ORDER — HYDROMORPHONE HCL 4 MG PO TABS
4.0000 mg | ORAL_TABLET | Freq: Four times a day (QID) | ORAL | 0 refills | Status: AC | PRN
  Filled 2024-06-15: qty 120, 30d supply, fill #0

## 2024-05-27 MED ORDER — HYDROMORPHONE HCL 4 MG PO TABS
4.0000 mg | ORAL_TABLET | Freq: Four times a day (QID) | ORAL | 0 refills | Status: AC | PRN
  Filled 2024-07-15: qty 120, 30d supply, fill #0

## 2024-06-14 ENCOUNTER — Other Ambulatory Visit: Payer: Self-pay

## 2024-06-14 ENCOUNTER — Other Ambulatory Visit (HOSPITAL_BASED_OUTPATIENT_CLINIC_OR_DEPARTMENT_OTHER): Payer: Self-pay

## 2024-06-15 ENCOUNTER — Other Ambulatory Visit (HOSPITAL_BASED_OUTPATIENT_CLINIC_OR_DEPARTMENT_OTHER): Payer: Self-pay

## 2024-07-15 ENCOUNTER — Other Ambulatory Visit (HOSPITAL_BASED_OUTPATIENT_CLINIC_OR_DEPARTMENT_OTHER): Payer: Self-pay

## 2024-07-18 ENCOUNTER — Encounter: Payer: Self-pay | Admitting: Family Medicine

## 2024-07-19 MED ORDER — BUPROPION HCL 75 MG PO TABS: 75.0000 mg | ORAL_TABLET | Freq: Two times a day (BID) | ORAL | 1 refills | Status: DC

## 2024-07-29 ENCOUNTER — Other Ambulatory Visit (HOSPITAL_BASED_OUTPATIENT_CLINIC_OR_DEPARTMENT_OTHER): Payer: Self-pay

## 2024-07-29 DIAGNOSIS — M961 Postlaminectomy syndrome, not elsewhere classified: Secondary | ICD-10-CM | POA: Diagnosis not present

## 2024-07-29 DIAGNOSIS — G894 Chronic pain syndrome: Secondary | ICD-10-CM | POA: Diagnosis not present

## 2024-07-29 DIAGNOSIS — M47812 Spondylosis without myelopathy or radiculopathy, cervical region: Secondary | ICD-10-CM | POA: Diagnosis not present

## 2024-07-29 DIAGNOSIS — Z79891 Long term (current) use of opiate analgesic: Secondary | ICD-10-CM | POA: Diagnosis not present

## 2024-07-29 MED ORDER — HYDROMORPHONE HCL 4 MG PO TABS: 4.0000 mg | ORAL_TABLET | Freq: Four times a day (QID) | ORAL | 0 refills | Status: AC | PRN

## 2024-07-29 MED ORDER — HYDROMORPHONE HCL 4 MG PO TABS
4.0000 mg | ORAL_TABLET | Freq: Four times a day (QID) | ORAL | 0 refills | Status: AC | PRN
  Filled 2024-08-12: qty 120, 30d supply, fill #0

## 2024-08-12 ENCOUNTER — Other Ambulatory Visit (HOSPITAL_BASED_OUTPATIENT_CLINIC_OR_DEPARTMENT_OTHER): Payer: Self-pay

## 2024-08-12 ENCOUNTER — Other Ambulatory Visit: Payer: Self-pay | Admitting: Medical Genetics

## 2024-08-12 DIAGNOSIS — Z006 Encounter for examination for normal comparison and control in clinical research program: Secondary | ICD-10-CM

## 2024-08-26 ENCOUNTER — Ambulatory Visit: Attending: Cardiology | Admitting: Cardiology

## 2024-08-26 ENCOUNTER — Encounter: Payer: Self-pay | Admitting: Cardiology

## 2024-08-26 VITALS — BP 136/84 | HR 66 | Ht 72.0 in | Wt 207.0 lb

## 2024-08-26 DIAGNOSIS — I7121 Aneurysm of the ascending aorta, without rupture: Secondary | ICD-10-CM

## 2024-08-26 DIAGNOSIS — I2510 Atherosclerotic heart disease of native coronary artery without angina pectoris: Secondary | ICD-10-CM

## 2024-08-26 MED ORDER — ASPIRIN 81 MG PO TBEC: 81.0000 mg | DELAYED_RELEASE_TABLET | Freq: Every day | ORAL | Status: AC

## 2024-08-26 NOTE — Patient Instructions (Signed)
Medication Instructions:   Begin Aspirin 81mg  daily.   Continue all other medications.    Labwork: none  Testing/Procedures: none  Follow-Up: 6 months   Any Other Special Instructions Will Be Listed Below (If Applicable).  If you need a refill on your cardiac medications before your next appointment, please call your pharmacy.

## 2024-08-26 NOTE — Progress Notes (Signed)
 Clinical Summary Shawn Russell is a 63 y.o.male seen today as a new consult, referred by Dr Dettinger for the following medical problems.  1.Coronary atherosclerosis - noted by CT lung cancer screen - 02/2024 CT: right coronary artery atherosclerosis  CAD risk factors: +smoker x 50 years, HLD   12/2023 TC 192 TG 63 HDL 64 LDL 116 - pcp recently started crestor  - no chest pains. No SOB/DOE - limited by chronic knee pain, back pain. Walks up flight of stairs regularly without symptoms.     2. Dilated aorta - 02/2024 lung cancer screen CT showed 4.2 cm ascending aorta    Past Medical History:  Diagnosis Date   Chronic back pain    Chronic constipation    Chronic hepatitis C without hepatic coma (HCC)    blood transfusion 1988;   per pt was treated with mediation approx. 2010 was followed by liver clinic @ [facility] and has been cured   COPD (chronic obstructive pulmonary disease) (HCC)    Degenerative disc disease, lumbar    Depression    ED (erectile dysfunction)    GERD (gastroesophageal reflux disease)    Hearing loss of both ears    per pt has hearing aids but does not wear   History of esophageal dilatation 2014   History of hypogonadism    History of kidney stones    History of TIAs    per pt approx. 2004   OA (osteoarthritis)    knees   Renal cyst, left    Spinal stenosis      No Known Allergies   Current Outpatient Medications  Medication Sig Dispense Refill   buPROPion  (WELLBUTRIN ) 75 MG tablet Take 1 tablet (75 mg total) by mouth 2 (two) times daily. 14 tablet 1   HYDROmorphone  (DILAUDID ) 4 MG tablet Take 1 tablet (4 mg total) by mouth 4 (four) times daily as needed for pain. (03-22-24) 120 tablet 0   HYDROmorphone  (DILAUDID ) 4 MG tablet Take 1 tablet (4 mg total) by mouth 4 (four) times daily as needed for pain 120 tablet 0   HYDROmorphone  (DILAUDID ) 4 MG tablet Take 1 tablet (4 mg total) by mouth 4 (four) times daily as needed for pain. 120 tablet 0    HYDROmorphone  (DILAUDID ) 4 MG tablet Take 1 tablet (4 mg total) by mouth 4 (four) times daily as needed for pain. 120 tablet 0   HYDROmorphone  (DILAUDID ) 4 MG tablet Take 1 tablet (4 mg total) by mouth 4 (four) times daily as needed for pain. 120 tablet 0   HYDROmorphone  (DILAUDID ) 4 MG tablet Take 1 tablet (4 mg total) by mouth 4 (four) times daily as needed for pain. 120 tablet 0   HYDROmorphone  (DILAUDID ) 4 MG tablet Take 1 tablet (4 mg total) by mouth 4 (four) times daily as needed. 120 tablet 0   [START ON 09/10/2024] HYDROmorphone  (DILAUDID ) 4 MG tablet Take 1 tablet (4 mg total) by mouth 4 (four) times daily as needed. 120 tablet 0   polyethylene glycol powder (GLYCOLAX/MIRALAX) 17 GM/SCOOP powder Take 17 g by mouth once.     rosuvastatin  (CRESTOR ) 10 MG tablet Take 1 tablet (10 mg total) by mouth daily. 90 tablet 3   No current facility-administered medications for this visit.     Past Surgical History:  Procedure Laterality Date   ANKLE SURGERY Right 1990   ORIF right ankle fracture;  retained hardware   COLONOSCOPY WITH PROPOFOL  N/A 02/14/2013   Procedure: COLONOSCOPY WITH PROPOFOL ;  Surgeon: Lamar CHRISTELLA Hollingshead, MD;  Location: [facility];  Service: Endoscopy;  Laterality: N/A;  At cecum:09:16,  cecal withdrawal time:0930, total cecal time:14 minutes   ESOPHAGOGASTRODUODENOSCOPY (EGD) WITH PROPOFOL  N/A 02/14/2013   Procedure: ESOPHAGOGASTRODUODENOSCOPY (EGD) WITH PROPOFOL ;  Surgeon: Lamar CHRISTELLA Hollingshead, MD;  Location: [facility];  Service: Endoscopy;  Laterality: N/A;   kidney stone extraction  1989   ureteroscopy stone extraction   KNEE SURGERY Left 1995   Reconstruction w/ retained hardware   MALONEY DILATION N/A 02/14/2013   Procedure: AGAPITO DILATION;  Surgeon: Lamar CHRISTELLA Hollingshead, MD;  Location: [facility];  Service: Endoscopy;  Laterality: N/A;   POSTERIOR LUMBAR FUSION  10-04-2004   dr jinny. mavis @[facility]    laminectomy and fusion L3 -- L5   POSTERIOR LUMBAR FUSION  06-26-2006   dr jinny. mavis  @[facility]     Re-Do laminectomy L3,  fusion L2 -- L4   ROTATOR CUFF REPAIR Right 2000   SPERMATOCELECTOMY Right 12/13/2018   Procedure: SPERMATOCELECTOMY;  Surgeon: Watt Rush, MD;  Location: [facility];  Service: Urology;  Laterality: Right;   surgery on ears Bilateral x3   as child     No Known Allergies    Family History  Problem Relation Age of Onset   Heart disease Mother    Osteoarthritis Mother    Heart disease Father    Colon cancer Neg Hx    Liver disease Neg Hx      Social History Shawn Russell reports that he has been smoking cigarettes. He has a 40 pack-year smoking history. He has never used smokeless tobacco. Shawn Russell reports no history of alcohol use.    Physical Examination Today's Vitals   08/26/24 1034 08/26/24 1040  BP: (!) 140/88 136/84  Pulse: 66   SpO2: 100%   Weight: 207 lb (93.9 kg)   Height: 6' (1.829 m)   PainSc: 6    PainLoc: Back    Body mass index is 28.07 kg/m.  Gen: resting comfortably, no acute distress HEENT: no scleral icterus, pupils equal round and reactive, no palptable cervical adenopathy,  CV: RRR, no m/rg, no jvd Resp: Clear to auscultation bilaterally GI: abdomen is soft, non-tender, non-distended, normal bowel sounds, no hepatosplenomegaly MSK: extremities are warm, no edema.  Skin: warm, no rash Neuro:  no focal deficits Psych: appropriate affect     Assessment and Plan  1.Coronary atherosclerosis - incidental finding on lung cancer screening CT - no symptoms. In absence of symptoms would focus on risk factor modification and medical therapy. Would not plan for ischemic testing at this time - continue statin, add AS 81mg  daily. Counseled on smoking cessation - EKG shows NSR, no ischemic changes  2. Aortic aneurysm - - incidental finding on lung cancer screening CT, 4.2 cm ascending aorta which is mild - monitor at this time, annual CT. Reasonable to followe lung cancer screening CT's, if not done would plan for CTA next  year    F/u 6 months      Dorn PHEBE Ross, M.D.

## 2024-09-06 ENCOUNTER — Other Ambulatory Visit (HOSPITAL_BASED_OUTPATIENT_CLINIC_OR_DEPARTMENT_OTHER): Payer: Self-pay

## 2024-09-09 ENCOUNTER — Other Ambulatory Visit (HOSPITAL_BASED_OUTPATIENT_CLINIC_OR_DEPARTMENT_OTHER): Payer: Self-pay

## 2024-09-30 ENCOUNTER — Other Ambulatory Visit (HOSPITAL_BASED_OUTPATIENT_CLINIC_OR_DEPARTMENT_OTHER): Payer: Self-pay

## 2024-09-30 MED ORDER — HYDROMORPHONE HCL 4 MG PO TABS
4.0000 mg | ORAL_TABLET | Freq: Every day | ORAL | 0 refills | Status: AC | PRN
  Filled 2024-10-07: qty 150, 32d supply, fill #0

## 2024-09-30 MED ORDER — HYDROMORPHONE HCL 4 MG PO TABS
4.0000 mg | ORAL_TABLET | Freq: Every day | ORAL | 0 refills | Status: AC | PRN
  Filled 2024-11-05: qty 150, 32d supply, fill #0
  Filled 2024-11-05: qty 150, 30d supply, fill #0

## 2024-10-05 ENCOUNTER — Other Ambulatory Visit (HOSPITAL_BASED_OUTPATIENT_CLINIC_OR_DEPARTMENT_OTHER): Payer: Self-pay

## 2024-10-07 ENCOUNTER — Other Ambulatory Visit (HOSPITAL_BASED_OUTPATIENT_CLINIC_OR_DEPARTMENT_OTHER): Payer: Self-pay

## 2024-10-25 LAB — GENECONNECT MOLECULAR SCREEN: Genetic Analysis Overall Interpretation: NEGATIVE

## 2024-11-01 ENCOUNTER — Other Ambulatory Visit (HOSPITAL_BASED_OUTPATIENT_CLINIC_OR_DEPARTMENT_OTHER): Payer: Self-pay

## 2024-11-05 ENCOUNTER — Other Ambulatory Visit: Payer: Self-pay

## 2024-11-05 ENCOUNTER — Other Ambulatory Visit (HOSPITAL_BASED_OUTPATIENT_CLINIC_OR_DEPARTMENT_OTHER): Payer: Self-pay

## 2024-11-06 ENCOUNTER — Other Ambulatory Visit (HOSPITAL_BASED_OUTPATIENT_CLINIC_OR_DEPARTMENT_OTHER): Payer: Self-pay
